# Patient Record
Sex: Female | Born: 1970 | Race: White | Hispanic: No | Marital: Married | Smoking: Never smoker
Health system: Southern US, Community
[De-identification: ages and names within clinical notes are randomized; demographics above are authoritative.]

## PROBLEM LIST (undated history)

## (undated) DIAGNOSIS — R112 Nausea with vomiting, unspecified: Secondary | ICD-10-CM

## (undated) DIAGNOSIS — T8859XA Other complications of anesthesia, initial encounter: Secondary | ICD-10-CM

## (undated) DIAGNOSIS — F329 Major depressive disorder, single episode, unspecified: Secondary | ICD-10-CM

## (undated) DIAGNOSIS — Z9889 Other specified postprocedural states: Secondary | ICD-10-CM

## (undated) DIAGNOSIS — F419 Anxiety disorder, unspecified: Secondary | ICD-10-CM

## (undated) DIAGNOSIS — F32A Depression, unspecified: Secondary | ICD-10-CM

## (undated) HISTORY — PX: WISDOM TOOTH EXTRACTION: SHX21

## (undated) HISTORY — DX: Depression, unspecified: F32.A

## (undated) HISTORY — DX: Major depressive disorder, single episode, unspecified: F32.9

## (undated) HISTORY — DX: Anxiety disorder, unspecified: F41.9

## (undated) HISTORY — PX: DILATION AND CURETTAGE OF UTERUS: SHX78

## (undated) HISTORY — PX: TUBAL LIGATION: SHX77

---

## 2010-02-08 ENCOUNTER — Emergency Department: Payer: Self-pay | Admitting: Emergency Medicine

## 2010-08-03 ENCOUNTER — Ambulatory Visit: Payer: Self-pay

## 2011-09-21 ENCOUNTER — Ambulatory Visit: Payer: Self-pay

## 2012-11-19 ENCOUNTER — Ambulatory Visit: Payer: Self-pay

## 2014-01-30 ENCOUNTER — Ambulatory Visit: Payer: Self-pay

## 2014-12-02 ENCOUNTER — Telehealth: Payer: Self-pay | Admitting: Family Medicine

## 2014-12-02 NOTE — Telephone Encounter (Signed)
Pt called stated she needs a refill on Omeprazole. Pharm is Therapist, [facility] in [place].

## 2014-12-03 MED ORDER — OMEPRAZOLE 20 MG PO CPDR: 20.0000 mg | DELAYED_RELEASE_CAPSULE | Freq: Every day | ORAL | Status: DC

## 2014-12-03 NOTE — Telephone Encounter (Signed)
Please let OSWIN ELLICK know that I'd like to see patient for an appointment here in the office for:  Acid reflux, stomach issues She was seen in March and was asked to f/u about 3 weeks later Please schedule a visit with me  in the next: few weeks Fasting?  Not necessary Thank you, Dr. Sherie Don

## 2014-12-03 NOTE — Telephone Encounter (Signed)
Left vm pt # to call us back and schedule F/U for acid reflux and stomach issues.

## 2014-12-20 ENCOUNTER — Other Ambulatory Visit: Payer: Self-pay | Admitting: Unknown Physician Specialty

## 2014-12-22 NOTE — Telephone Encounter (Signed)
Please contact patient to schedule follow up on her anxiety

## 2014-12-23 NOTE — Telephone Encounter (Signed)
Called and scheduled patient an appointment.

## 2015-01-01 ENCOUNTER — Ambulatory Visit: Payer: Self-pay | Admitting: Family Medicine

## 2015-01-07 DIAGNOSIS — F32A Depression, unspecified: Secondary | ICD-10-CM | POA: Insufficient documentation

## 2015-01-07 DIAGNOSIS — F419 Anxiety disorder, unspecified: Secondary | ICD-10-CM | POA: Insufficient documentation

## 2015-01-07 DIAGNOSIS — F329 Major depressive disorder, single episode, unspecified: Secondary | ICD-10-CM | POA: Insufficient documentation

## 2015-01-08 ENCOUNTER — Encounter: Payer: Self-pay | Admitting: Family Medicine

## 2015-01-08 ENCOUNTER — Ambulatory Visit (INDEPENDENT_AMBULATORY_CARE_PROVIDER_SITE_OTHER): Payer: BC Managed Care – PPO | Admitting: Family Medicine

## 2015-01-08 VITALS — BP 103/71 | HR 80 | Temp 98.2°F | Ht 66.0 in | Wt 157.0 lb

## 2015-01-08 DIAGNOSIS — F329 Major depressive disorder, single episode, unspecified: Secondary | ICD-10-CM

## 2015-01-08 DIAGNOSIS — F419 Anxiety disorder, unspecified: Secondary | ICD-10-CM | POA: Diagnosis not present

## 2015-01-08 DIAGNOSIS — F32A Depression, unspecified: Secondary | ICD-10-CM

## 2015-01-08 DIAGNOSIS — K219 Gastro-esophageal reflux disease without esophagitis: Secondary | ICD-10-CM | POA: Diagnosis not present

## 2015-01-08 MED ORDER — VENLAFAXINE HCL 37.5 MG PO TABS: 37.5000 mg | ORAL_TABLET | Freq: Two times a day (BID) | ORAL | Status: DC

## 2015-01-08 MED ORDER — PROMETHAZINE HCL 25 MG PO TABS: 25.0000 mg | ORAL_TABLET | Freq: Four times a day (QID) | ORAL | Status: DC | PRN

## 2015-01-08 MED ORDER — OMEPRAZOLE 20 MG PO CPDR: 20.0000 mg | DELAYED_RELEASE_CAPSULE | Freq: Every day | ORAL | Status: DC | PRN

## 2015-01-08 NOTE — Assessment & Plan Note (Signed)
Well-controlled on current meds 

## 2015-01-08 NOTE — Patient Instructions (Addendum)
Try to wean down caffeine and carbonated beverages Try elevating the head of the bed You have my blessing to skip days of the omeprazole Do get 3 servings of calcium a day Prescriptions should be ready at [facility] avoidance is key when traveling! Return in 12+ months, sooner if needed  Gastroesophageal Reflux Disease, Adult Gastroesophageal reflux disease (GERD) happens when acid from your stomach flows up into the esophagus. When acid comes in contact with the esophagus, the acid causes soreness (inflammation) in the esophagus. Over time, GERD may create small holes (ulcers) in the lining of the esophagus. CAUSES   Increased body weight. This puts pressure on the stomach, making acid rise from the stomach into the esophagus.  Smoking. This increases acid production in the stomach.  Drinking alcohol. This causes decreased pressure in the lower esophageal sphincter (valve or ring of muscle between the esophagus and stomach), allowing acid from the stomach into the esophagus.  Late evening meals and a full stomach. This increases pressure and acid production in the stomach.  A malformed lower esophageal sphincter. Sometimes, no cause is found. SYMPTOMS   Burning pain in the lower part of the mid-chest behind the breastbone and in the mid-stomach area. This may occur twice a week or more often.  Trouble swallowing.  Sore throat.  Dry cough.  Asthma-like symptoms including chest tightness, shortness of breath, or wheezing. DIAGNOSIS  Your caregiver may be able to diagnose GERD based on your symptoms. In some cases, X-rays and other tests may be done to check for complications or to check the condition of your stomach and esophagus. TREATMENT  Your caregiver may recommend over-the-counter or prescription medicines to help decrease acid production. Ask your caregiver before starting or adding any new medicines.  HOME CARE INSTRUCTIONS   Change the factors that you can  control. Ask your caregiver for guidance concerning weight loss, quitting smoking, and alcohol consumption.  Avoid foods and drinks that make your symptoms worse, such as:  Caffeine or alcoholic drinks.  Chocolate.  Peppermint or mint flavorings.  Garlic and onions.  Spicy foods.  Citrus fruits, such as oranges, lemons, or limes.  Tomato-based foods such as sauce, chili, salsa, and pizza.  Fried and fatty foods.  Avoid lying down for the 3 hours prior to your bedtime or prior to taking a nap.  Eat small, frequent meals instead of large meals.  Wear loose-fitting clothing. Do not wear anything tight around your waist that causes pressure on your stomach.  Raise the head of your bed 6 to 8 inches with wood blocks to help you sleep. Extra pillows will not help.  Only take over-the-counter or prescription medicines for pain, discomfort, or fever as directed by your caregiver.  Do not take aspirin, ibuprofen, or other nonsteroidal anti-inflammatory drugs (NSAIDs). SEEK IMMEDIATE MEDICAL CARE IF:   You have pain in your arms, neck, jaw, teeth, or back.  Your pain increases or changes in intensity or duration.  You develop nausea, vomiting, or sweating (diaphoresis).  You develop shortness of breath, or you faint.  Your vomit is green, yellow, black, or looks like coffee grounds or blood.  Your stool is red, bloody, or black. These symptoms could be signs of other problems, such as heart disease, gastric bleeding, or esophageal bleeding. MAKE SURE YOU:   Understand these instructions.  Will watch your condition.  Will get help right away if you are not doing well or get worse. Document Released: 03/15/2005 Document Revised: 08/28/2011 Document  Reviewed: 12/23/2010 ExitCare Patient Information 2015 [place], [place]. This information is not intended to replace advice given to you by your health care provider. Make sure you discuss any questions you have with your health  care provider.

## 2015-01-08 NOTE — Progress Notes (Signed)
BP 103/71 mmHg  Pulse 80  Temp(Src) 98.2 F (36.8 C)  Ht  (1.676 m)  Wt 157 lb (71.215 kg)  BMI 25.35 kg/m2  SpO2 100%  LMP 12/28/2014 (Exact Date)   Subjective:    Patient ID: Ruth Hammond, female    DOB: [birth year], 44 y.o.   MRN: [id number]  HPI: Ruth Hammond is a 44 y.o. female  Chief Complaint  Patient presents with  . Depression    med refill  . travel medications    just in case meds   She called about two weeks ago and is here for follow-up  She is taking omeprazole; has tried going off of it, and then 1-2 days later, she is still having issues with it She is elevating the head of the bed; she has some herniated discs, C6 and C5 she says; she has not had an epidural or anything She avoids juices, strong and avoids them; she doesn't eat many tomatoes, just once in a while; tomato sauce occasionally; tries to drink more water; does drink coffee, strong No cramps in feet or calves or hands; no skipped heartbeats  Taking venlafaxine; that is working well; no problems with anxiety; no issues with depression; has taken for a long time; she does not think she'll stop this, tried coming off before and did not do well  She is going to [place] and [place] She has been to [place] and got very sick, nasty bladder infection; has medicine for that she keeps on hand from her OB-GYN; she doesn't need that one; her stomach got very upset, nausea, nasty stomach; phenergan anti-nausea just in case; no need for malaria prophylaxis she says  Relevant past medical, surgical, family and social history reviewed and updated as indicated. Interim medical history since our last visit reviewed. Allergies and medications reviewed and updated.  Review of Systems Per HPI unless specifically indicated above     Objective:    BP 103/71 mmHg  Pulse 80  Temp(Src) 98.2 F (36.8 C)  Ht  (1.676 m)  Wt 157 lb (71.215 kg)  BMI 25.35 kg/m2  SpO2 100%  LMP 12/28/2014  (Exact Date)  Wt Readings from Last 3 Encounters:  01/08/15 157 lb (71.215 kg)  09/17/14 156 lb (70.761 kg)    Physical Exam  Constitutional: She appears well-developed and well-nourished. No distress.  Eyes: EOM are normal. No scleral icterus.  Cardiovascular: Normal rate and regular rhythm.   No extrasystoles are present.  Pulmonary/Chest: Effort normal and breath sounds normal.  Abdominal: She exhibits no distension.  Neurological: She displays no tremor.  Reflex Scores:      Patellar reflexes are 2+ on the right side and 2+ on the left side. Skin: No bruising and no rash noted.  Psychiatric: She has a normal mood and affect. Her speech is normal and behavior is normal. Judgment and thought content normal. Cognition and memory are normal.  Vitals reviewed.   No results found for this or any previous visit.    Assessment & Plan:   Problem List Items Addressed This Visit      Digestive   GERD (gastroesophageal reflux disease) - Primary    Avoid triggers; try half decaf coffee, elevated HOB; risks of prolonged PPI discussed      Relevant Medications   omeprazole (PRILOSEC) 20 MG capsule     Other   Anxiety    Well-controlled on current meds      Relevant  Medications   venlafaxine (EFFEXOR) 37.5 MG tablet   Depression    Well-controlled, continue medicine, one year Rx given      Relevant Medications   venlafaxine (EFFEXOR) 37.5 MG tablet      Follow up plan: Return in about 1 year (around 01/08/2016) for mood, reflus.

## 2015-01-08 NOTE — Assessment & Plan Note (Signed)
Well-controlled, continue medicine, one year Rx given

## 2015-01-08 NOTE — Assessment & Plan Note (Addendum)
Avoid triggers; try half decaf coffee, elevated HOB; risks of prolonged PPI discussed

## 2015-01-19 ENCOUNTER — Other Ambulatory Visit: Payer: Self-pay | Admitting: Obstetrics and Gynecology

## 2015-01-19 DIAGNOSIS — Z1231 Encounter for screening mammogram for malignant neoplasm of breast: Secondary | ICD-10-CM

## 2015-02-01 ENCOUNTER — Ambulatory Visit
Admission: RE | Admit: 2015-02-01 | Discharge: 2015-02-01 | Disposition: A | Discharge status: Home / Self Care | Payer: BC Managed Care – PPO | Source: Ambulatory Visit | Attending: Obstetrics and Gynecology | Admitting: Obstetrics and Gynecology

## 2015-02-01 DIAGNOSIS — Z1231 Encounter for screening mammogram for malignant neoplasm of breast: Secondary | ICD-10-CM | POA: Insufficient documentation

## 2015-02-03 ENCOUNTER — Other Ambulatory Visit: Payer: Self-pay | Admitting: Obstetrics and Gynecology

## 2015-02-03 DIAGNOSIS — N63 Unspecified lump in unspecified breast: Secondary | ICD-10-CM

## 2015-02-03 DIAGNOSIS — R928 Other abnormal and inconclusive findings on diagnostic imaging of breast: Secondary | ICD-10-CM

## 2015-02-04 ENCOUNTER — Ambulatory Visit: Payer: BC Managed Care – PPO

## 2015-02-04 ENCOUNTER — Ambulatory Visit
Admission: RE | Admit: 2015-02-04 | Discharge: 2015-02-04 | Disposition: A | Discharge status: Home / Self Care | Payer: BC Managed Care – PPO | Source: Ambulatory Visit | Attending: Obstetrics and Gynecology | Admitting: Obstetrics and Gynecology

## 2015-02-04 DIAGNOSIS — R928 Other abnormal and inconclusive findings on diagnostic imaging of breast: Secondary | ICD-10-CM | POA: Insufficient documentation

## 2015-02-04 DIAGNOSIS — N63 Unspecified lump in unspecified breast: Secondary | ICD-10-CM

## 2015-05-27 ENCOUNTER — Ambulatory Visit: Payer: BC Managed Care – PPO | Admitting: Family Medicine

## 2015-06-07 ENCOUNTER — Encounter: Payer: Self-pay | Admitting: Family Medicine

## 2015-06-07 ENCOUNTER — Ambulatory Visit (INDEPENDENT_AMBULATORY_CARE_PROVIDER_SITE_OTHER): Payer: BC Managed Care – PPO | Admitting: Family Medicine

## 2015-06-07 VITALS — BP 109/77 | HR 78 | Temp 98.1°F | Ht 66.0 in | Wt 157.0 lb

## 2015-06-07 DIAGNOSIS — R51 Headache: Secondary | ICD-10-CM | POA: Diagnosis not present

## 2015-06-07 DIAGNOSIS — M5412 Radiculopathy, cervical region: Secondary | ICD-10-CM | POA: Diagnosis not present

## 2015-06-07 DIAGNOSIS — G8929 Other chronic pain: Secondary | ICD-10-CM

## 2015-06-07 MED ORDER — PROMETHAZINE HCL 25 MG PO TABS: 25.0000 mg | ORAL_TABLET | Freq: Four times a day (QID) | ORAL | Status: DC | PRN

## 2015-06-07 MED ORDER — GABAPENTIN 100 MG PO CAPS: 100.0000 mg | ORAL_CAPSULE | Freq: Every day | ORAL | Status: DC

## 2015-06-07 NOTE — Patient Instructions (Addendum)
I've put in the referral to Dr. Manson PasseyBrown for you If you have not heard anything from my staff in a week about any orders/referrals/studies from today, please contact us here to follow-up [phone] We'll get the head CT just to be thorough We'll start the gabapentin Use the phenergan if needed If you have any problems, please call Do try to get three servings of calcium a day and supplement if needed

## 2015-06-07 NOTE — Progress Notes (Signed)
BP 109/77 mmHg  Pulse 78  Temp(Src) 98.1 F (36.7 C)  Ht 5\' 6"  (1.676 m)  Wt 157 lb (71.215 kg)  BMI 25.35 kg/m2  SpO2 99%  LMP 06/02/2015 (Exact Date)   Subjective:    Patient ID: Ruth Hammond, female    DOB: 03/07/1971, 44 y.o.   MRN: [id number]  HPI: Ruth IbaLaura C Kegel is a 44 y.o. female  Chief Complaint  Patient presents with  . Referral    She'd like to get a referral to a neurologist for 2 herniated disks in her neck, c5-c6   She says about two years ago, maybe two and a half years ago,, she had a very difficult class, high stress, teaching fourth grade; had pain in the left bicep; thought maybe pulled a muscle; went [facility] [place] (ortho); what he tried didn't work, then in May she returned and they did an MRI; she has two herniated discs at "C5 and C6" she thinks, referred to Dr. Trey SailorsMark Roy in [place], neurologist; saw him over the summer; he recommended a pain management doctor for a shot in her neck, so 18 months ago; her mother was sick, she didn't feel comfortable getting a shot in her neck; did not go get it, just too much sickness in her fmaily at that time; did not go back to him; probably should have she says She is having tingling and numbness in her 4th and 5th fingers of the left hand Severe headaches once a month or so, last a whole day; she told her GYN about them; told her to tell the neurologist; thought maybe a hormonal; not always with her period though; the last two months, would start the day after her period started; before then it was random; first day it's really bad, then better after that; starts back of the left side occiput and goes over the ear to the temple; nothing really helps it; she has tried ibuprofen, migraine medicine, nothing touches it; she lays down and tries to sleep it off; causes nausea; uses phenergan and that helps her to get to sleep No confusion, no daily headaches, not waking up with vomiting No aura; no limitations of neck  movement, but does feel like a crick in the neck  Also having some pain on the right now; everything previously was on the left She would like to see Dr. Erroll Lunahristopher Brown Pain of the headaches gets to a 9 out of 10 Pain of the left arm is like a toothache, worst pain is 5 out 10 She is left-handed  Relevant past medical, surgical, family and social history reviewed and updated as indicated. Interim medical history since our last visit reviewed. Allergies and medications reviewed and updated.  Review of Systems Per HPI unless specifically indicated above     Objective:    BP 109/77 mmHg  Pulse 78  Temp(Src) 98.1 F (36.7 C)  Ht 5\' 6"  (1.676 m)  Wt 157 lb (71.215 kg)  BMI 25.35 kg/m2  SpO2 99%  LMP 06/02/2015 (Exact Date)  Wt Readings from Last 3 Encounters:  06/07/15 157 lb (71.215 kg)  01/08/15 157 lb (71.215 kg)  09/17/14 156 lb (70.761 kg)    Physical Exam  Constitutional: She appears well-developed and well-nourished. No distress.  HENT:  Head: Normocephalic and atraumatic.  Right Ear: External ear normal.  Left Ear: External ear normal.  Nose: Nose normal.  Mouth/Throat: Oropharynx is clear and moist. No oropharyngeal exudate.  Eyes: EOM are normal.  Pupils are equal, round, and reactive to light. Right eye exhibits no discharge. Left eye exhibits no discharge. No scleral icterus.  Neck: Neck supple. No JVD present. No thyromegaly present.  Cardiovascular: Normal rate and regular rhythm.   Pulmonary/Chest: Effort normal and breath sounds normal.  Musculoskeletal: She exhibits no edema.  Lymphadenopathy:    She has no cervical adenopathy.  Neurological: She is alert. She displays no atrophy and normal reflexes. No cranial nerve deficit. She exhibits normal muscle tone. Coordination normal.  Skin: She is not diaphoretic. No erythema. No pallor.  Psychiatric: She has a normal mood and affect. Her behavior is normal. Judgment and thought content normal.       Assessment & Plan:   Problem List Items Addressed This Visit      Nervous and Auditory   Left cervical radiculopathy - Primary    Will be glad to refer her to the spine surgeon for evaluation of this per her request; referral entered; offered gabapentin, start Rx, we may titrate up if needed (patient may call)      Relevant Medications   gabapentin (NEURONTIN) 100 MG capsule   Other Relevant Orders   Ambulatory referral to Orthopedic Surgery    Other Visit Diagnoses    Chronic left-sided headaches        may be C3 radiculopathy, but unilaterality is concerning; will get CT scan    Relevant Orders    CT Head W Contrast       Follow up plan: Return if symptoms worsen or fail to improve.  An after-visit summary was printed and given to the patient at check-out.  Please see the patient instructions which may contain other information and recommendations beyond what is mentioned above in the assessment and plan. Orders Placed This Encounter  Procedures  . CT Head W Contrast  . Ambulatory referral to Orthopedic Surgery   Meds ordered this encounter  Medications  . gabapentin (NEURONTIN) 100 MG capsule    Sig: Take 1 capsule (100 mg total) by mouth at bedtime.    Dispense:  30 capsule    Refill:  0  . promethazine (PHENERGAN) 25 MG tablet    Sig: Take 1 tablet (25 mg total) by mouth every 6 (six) hours as needed for nausea or vomiting.    Dispense:  30 tablet    Refill:  0   Face-to-face time with patient was more than 25 minutes, >50% time spent counseling and coordination of care

## 2015-06-12 NOTE — Assessment & Plan Note (Signed)
Will be glad to refer her to the spine surgeon for evaluation of this per her request; referral entered; offered gabapentin, start Rx, we may titrate up if needed (patient may call)

## 2015-06-15 ENCOUNTER — Telehealth: Payer: Self-pay | Admitting: Family Medicine

## 2015-06-15 DIAGNOSIS — G8929 Other chronic pain: Secondary | ICD-10-CM

## 2015-06-15 DIAGNOSIS — R51 Headache: Principal | ICD-10-CM

## 2015-06-15 DIAGNOSIS — R519 Headache, unspecified: Secondary | ICD-10-CM | POA: Insufficient documentation

## 2015-06-15 NOTE — Telephone Encounter (Signed)
Cannot do CT of the head just with contrast, must be without or with and without.  Please call to clarify.

## 2015-06-15 NOTE — Telephone Encounter (Signed)
New order entered for with and without contrast Please CANCEL the head CT that just with contrast; we'll get with and without Thank you

## 2015-06-16 ENCOUNTER — Ambulatory Visit
Admission: RE | Admit: 2015-06-16 | Discharge: 2015-06-16 | Disposition: A | Discharge status: Home / Self Care | Payer: BC Managed Care – PPO | Source: Ambulatory Visit | Attending: Family Medicine | Admitting: Family Medicine

## 2015-06-16 DIAGNOSIS — R51 Headache: Secondary | ICD-10-CM | POA: Diagnosis not present

## 2015-06-16 DIAGNOSIS — G8929 Other chronic pain: Secondary | ICD-10-CM | POA: Insufficient documentation

## 2015-06-16 MED ORDER — IOHEXOL 300 MG/ML  SOLN
75.0000 mL | Freq: Once | INTRAMUSCULAR | Status: AC | PRN
Start: 2015-06-16 — End: 2015-06-16
  Administered 2015-06-16: 75 mL via INTRAVENOUS

## 2015-06-16 NOTE — Telephone Encounter (Signed)
Insurance has already approved her previous study the CT head with contrast only, even without having a previous study of CT head without contrast.

## 2015-10-18 HISTORY — PX: OTHER SURGICAL HISTORY: SHX169

## 2015-12-23 ENCOUNTER — Other Ambulatory Visit: Payer: Self-pay | Admitting: Family Medicine

## 2015-12-23 DIAGNOSIS — Z1231 Encounter for screening mammogram for malignant neoplasm of breast: Secondary | ICD-10-CM

## 2016-01-02 ENCOUNTER — Encounter: Payer: Self-pay | Admitting: Family Medicine

## 2016-01-02 DIAGNOSIS — Z01419 Encounter for gynecological examination (general) (routine) without abnormal findings: Secondary | ICD-10-CM | POA: Insufficient documentation

## 2016-02-02 ENCOUNTER — Other Ambulatory Visit: Payer: Self-pay | Admitting: Family Medicine

## 2016-02-02 ENCOUNTER — Ambulatory Visit
Admission: RE | Admit: 2016-02-02 | Discharge: 2016-02-02 | Disposition: A | Discharge status: Home / Self Care | Payer: BC Managed Care – PPO | Source: Ambulatory Visit | Attending: Family Medicine | Admitting: Family Medicine

## 2016-02-02 DIAGNOSIS — Z1231 Encounter for screening mammogram for malignant neoplasm of breast: Secondary | ICD-10-CM

## 2016-02-24 ENCOUNTER — Other Ambulatory Visit: Payer: Self-pay | Admitting: Family Medicine

## 2016-02-24 NOTE — Telephone Encounter (Signed)
It looks like Dr. Dossie Arbourrissman has ordered her mammogram; please find out [facility] or [facility]; Rx request should go to provider she will be seeing please

## 2016-02-24 NOTE — Telephone Encounter (Signed)
Left voice mail

## 2016-04-03 ENCOUNTER — Other Ambulatory Visit: Payer: Self-pay | Admitting: Family Medicine

## 2016-04-28 ENCOUNTER — Other Ambulatory Visit: Payer: Self-pay | Admitting: Family Medicine

## 2016-04-28 NOTE — Telephone Encounter (Signed)
Routing to provider. No follow up on file. 

## 2016-05-04 ENCOUNTER — Telehealth: Payer: Self-pay | Admitting: Family Medicine

## 2016-05-04 MED ORDER — VENLAFAXINE HCL 37.5 MG PO TABS: 37.5000 mg | ORAL_TABLET | Freq: Two times a day (BID) | ORAL | 1 refills | Status: DC

## 2016-05-04 NOTE — Telephone Encounter (Signed)
Routing to provider  

## 2016-05-15 ENCOUNTER — Ambulatory Visit: Payer: BC Managed Care – PPO | Admitting: Family Medicine

## 2016-05-18 ENCOUNTER — Ambulatory Visit (INDEPENDENT_AMBULATORY_CARE_PROVIDER_SITE_OTHER): Payer: BC Managed Care – PPO | Admitting: Family Medicine

## 2016-05-18 ENCOUNTER — Encounter: Payer: Self-pay | Admitting: Family Medicine

## 2016-05-18 VITALS — BP 115/73 | HR 88 | Temp 98.6°F | Wt 143.0 lb

## 2016-05-18 DIAGNOSIS — F330 Major depressive disorder, recurrent, mild: Secondary | ICD-10-CM

## 2016-05-18 DIAGNOSIS — Z23 Encounter for immunization: Secondary | ICD-10-CM

## 2016-05-18 MED ORDER — VENLAFAXINE HCL 37.5 MG PO TABS: 37.5000 mg | ORAL_TABLET | Freq: Two times a day (BID) | ORAL | 4 refills | Status: DC

## 2016-05-18 NOTE — Assessment & Plan Note (Signed)
Stable on effexor, continue current regimen 

## 2016-05-18 NOTE — Patient Instructions (Signed)
Follow up as needed

## 2016-05-18 NOTE — Progress Notes (Signed)
   BP 115/73   Pulse 88   Temp 98.6 F (37 C)   Wt 143 lb (64.9 kg)   LMP 05/14/2016 (Exact Date)   SpO2 100%   BMI 23.08 kg/m    Subjective:    Patient ID: Ruth Hammond, female    DOB: [birth year], 45 y.o.   MRN: [id number]  HPI: Ruth IbaLaura C Granieri is a 45 y.o. female  Chief Complaint  Patient presents with  . Depression    she needs a refill on her Effexor, she is doing well on it.   Patient presents for depression follow up. Doing very well on effexor, has been on the medication for years. No mood concerns, SI/HI, or other issues noted. Taking the medication faithfully without side effects.   Relevant past medical, surgical, family and social history reviewed and updated as indicated. Interim medical history since our last visit reviewed. Allergies and medications reviewed and updated.  Depression screen PHQ 2/9 05/18/2016  Decreased Interest 0  Down, Depressed, Hopeless 0  PHQ - 2 Score 0  Altered sleeping 0  Tired, decreased energy 0  Change in appetite 0  Feeling bad or failure about yourself  0  Trouble concentrating 0  Moving slowly or fidgety/restless 0  Suicidal thoughts 0  PHQ-9 Score 0   Review of Systems  Constitutional: Negative.   HENT: Negative.   Eyes: Negative.   Respiratory: Negative.   Cardiovascular: Negative.   Gastrointestinal: Negative.   Genitourinary: Negative.   Musculoskeletal: Negative.   Skin: Negative.   Neurological: Negative.   Psychiatric/Behavioral: Negative.     Per HPI unless specifically indicated above     Objective:    BP 115/73   Pulse 88   Temp 98.6 F (37 C)   Wt 143 lb (64.9 kg)   LMP 05/14/2016 (Exact Date)   SpO2 100%   BMI 23.08 kg/m   Wt Readings from Last 3 Encounters:  05/18/16 143 lb (64.9 kg)  06/07/15 157 lb (71.2 kg)  01/08/15 157 lb (71.2 kg)    Physical Exam  Constitutional: She is oriented to person, place, and time. She appears well-developed and well-nourished. No distress.  HENT:    Head: Atraumatic.  Eyes: Conjunctivae are normal. No scleral icterus.  Neck: Normal range of motion. Neck supple.  Cardiovascular: Normal rate and normal heart sounds.   Pulmonary/Chest: Effort normal. No respiratory distress.  Musculoskeletal: Normal range of motion.  Neurological: She is alert and oriented to person, place, and time.  Skin: Skin is warm and dry.  Psychiatric: She has a normal mood and affect. Her behavior is normal.  Nursing note and vitals reviewed.   No results found for this or any previous visit.    Assessment & Plan:   Problem List Items Addressed This Visit      Other   Depression    Stable on effexor, continue current regimen.       Relevant Medications   venlafaxine (EFFEXOR) 37.5 MG tablet    Other Visit Diagnoses    Needs flu shot    -  Primary   Encounter for immunization       Relevant Orders   Flu Vaccine QUAD 36+ mos IM (Completed)       Follow up plan: Return in about 1 year (around 05/18/2017) for Depression follow up. Gets physicals done with GYN. UTD on all health maintenance.

## 2016-07-21 IMAGING — MG MAM DGTL DIAGNOSTIC MAMMO W/CAD
4 series · 4 of 4 positions shown · non-contrast
Comparison: 11/19/2012 and prior mammograms dating back to
08/03/2010

CLINICAL DATA: 42-year-old female for annual bilateral mammograms
and retroareolar left breast and left nipple pain.

EXAM:
DIGITAL DIAGNOSTIC  BILATERAL MAMMOGRAM WITH CAD
ULTRASOUND LEFT BREAST

[R CC]
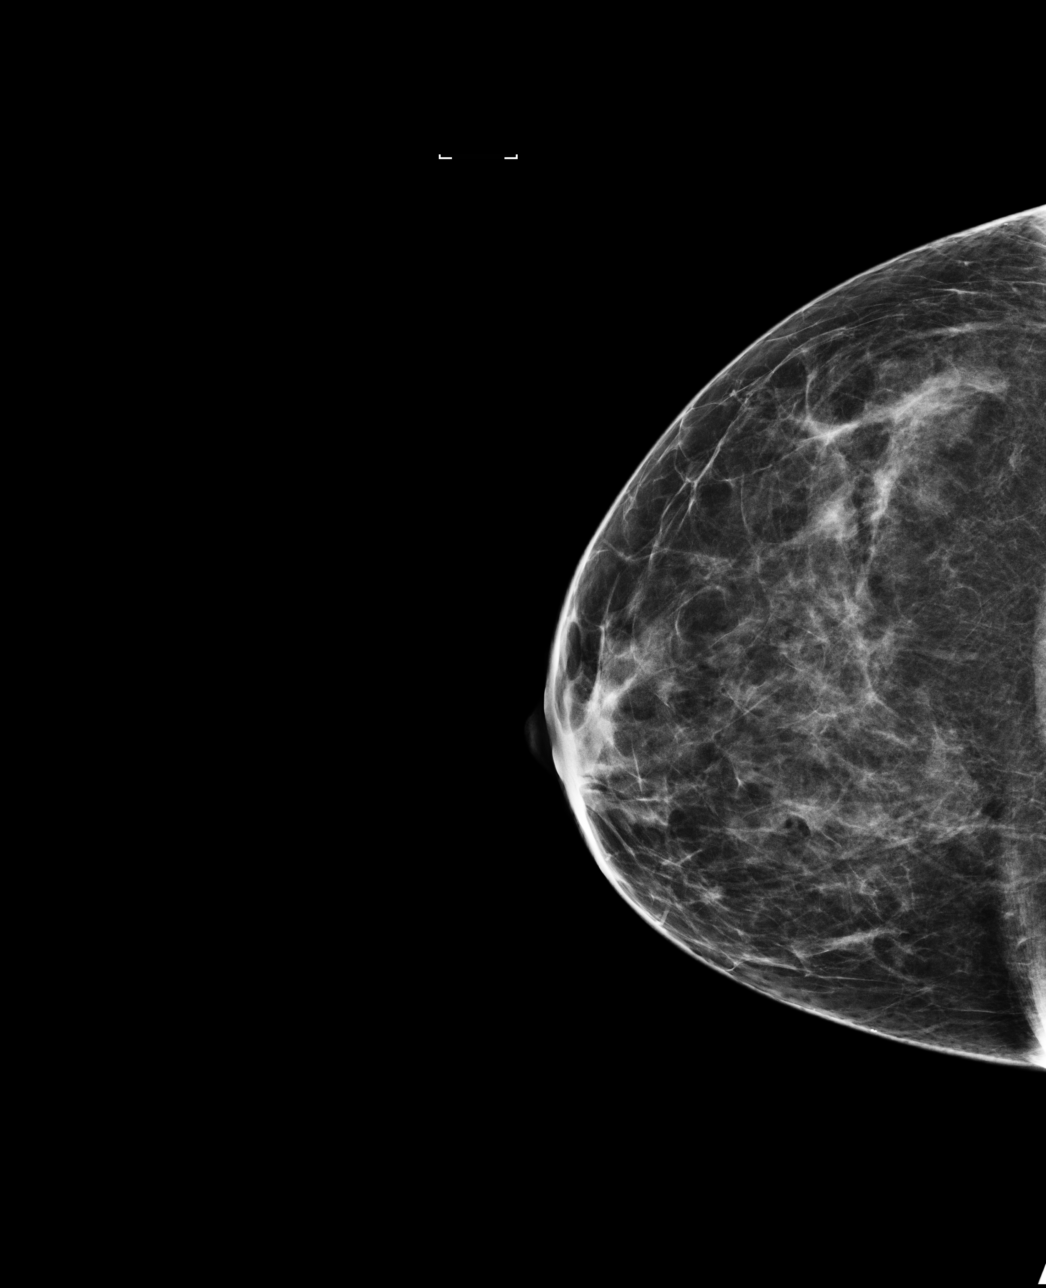

[L MLO]
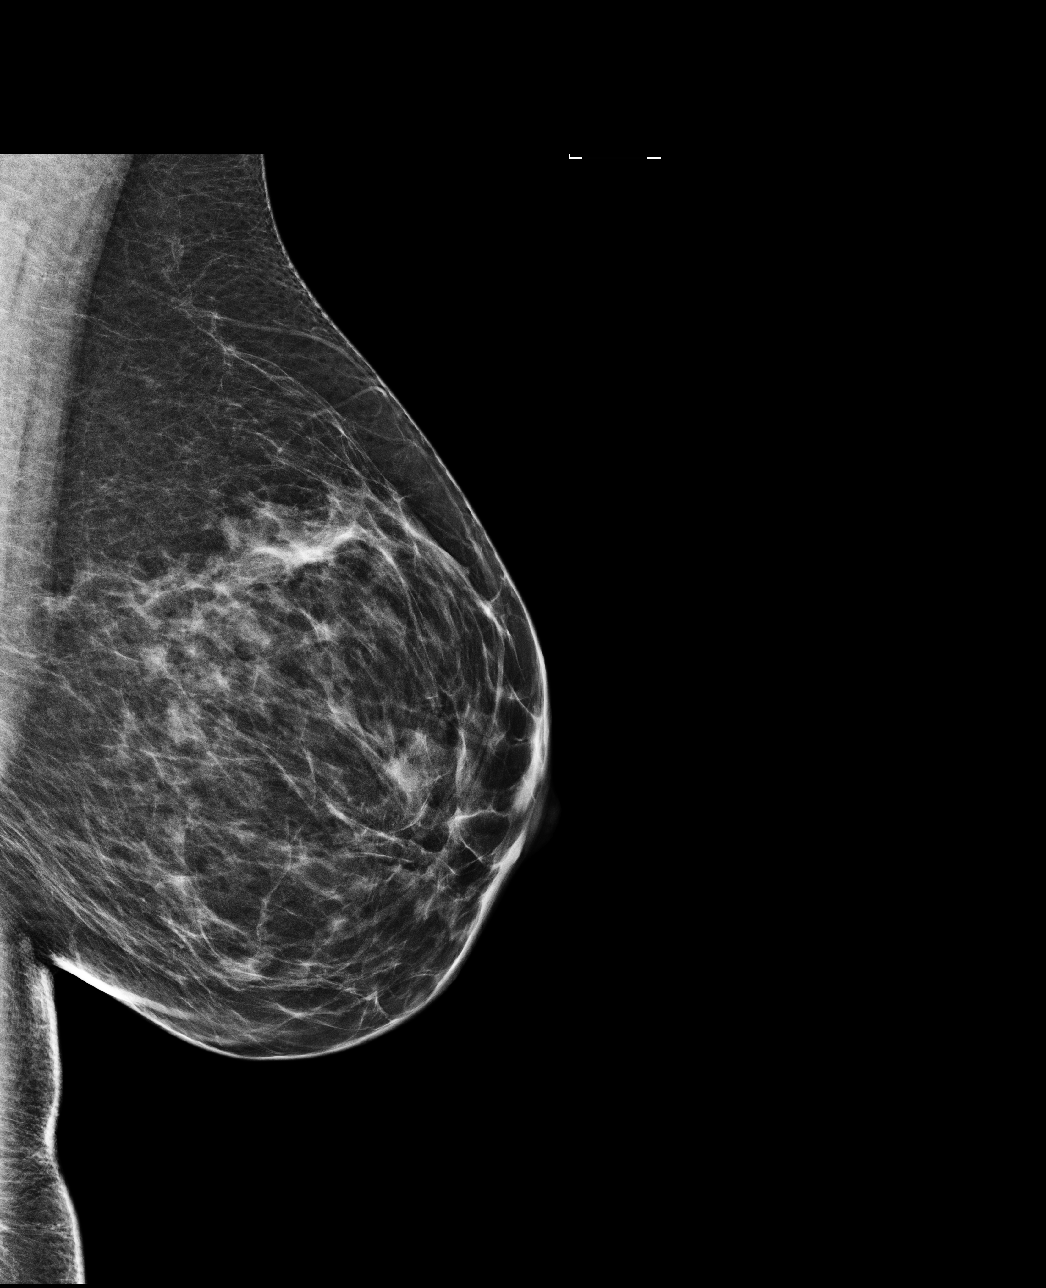

[L CC]
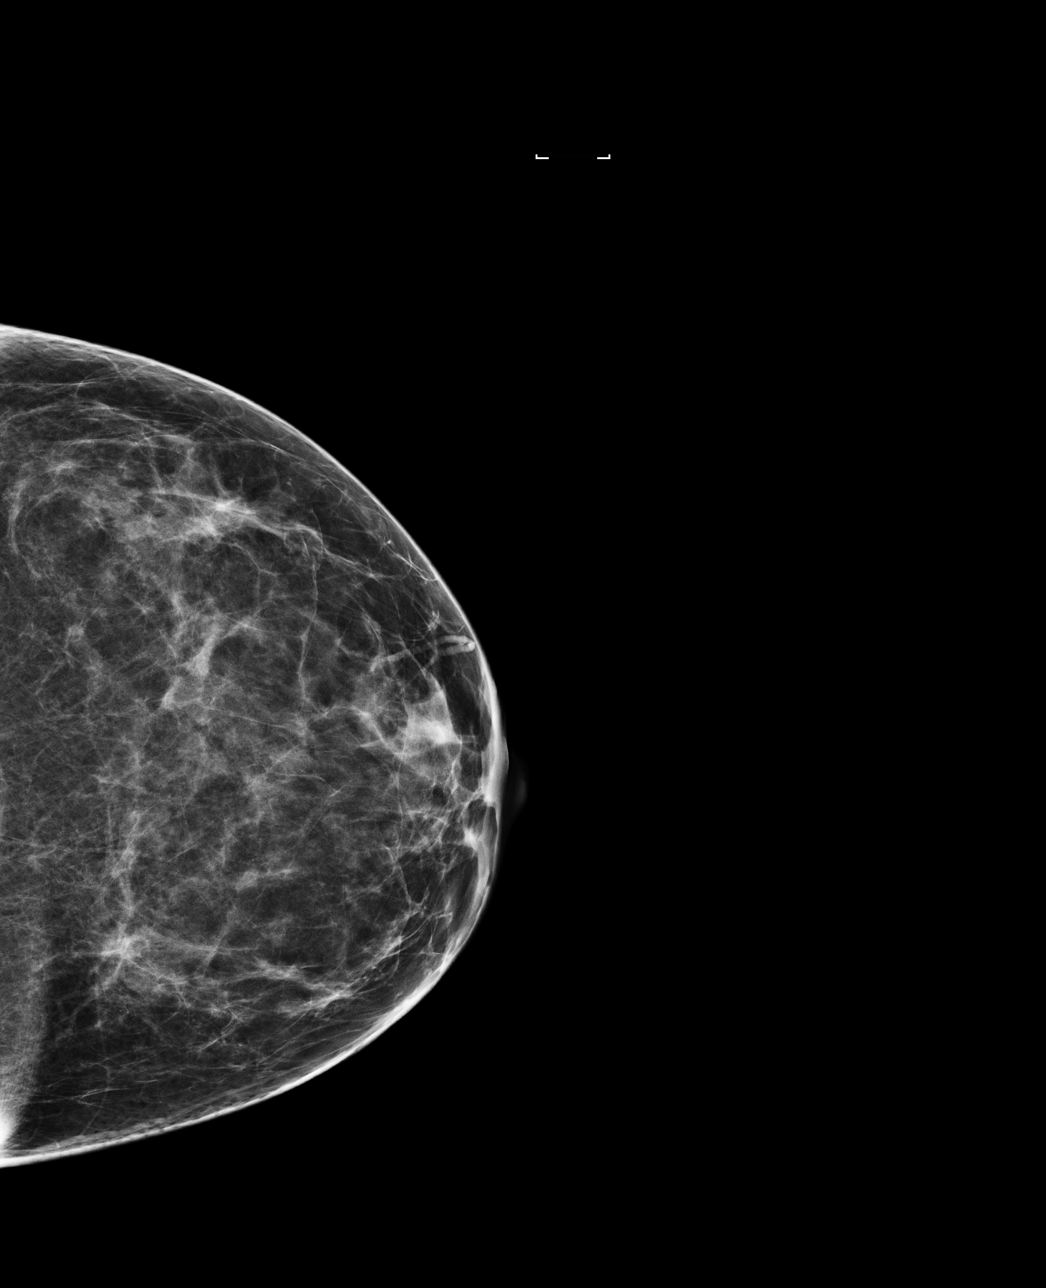

[R MLO]
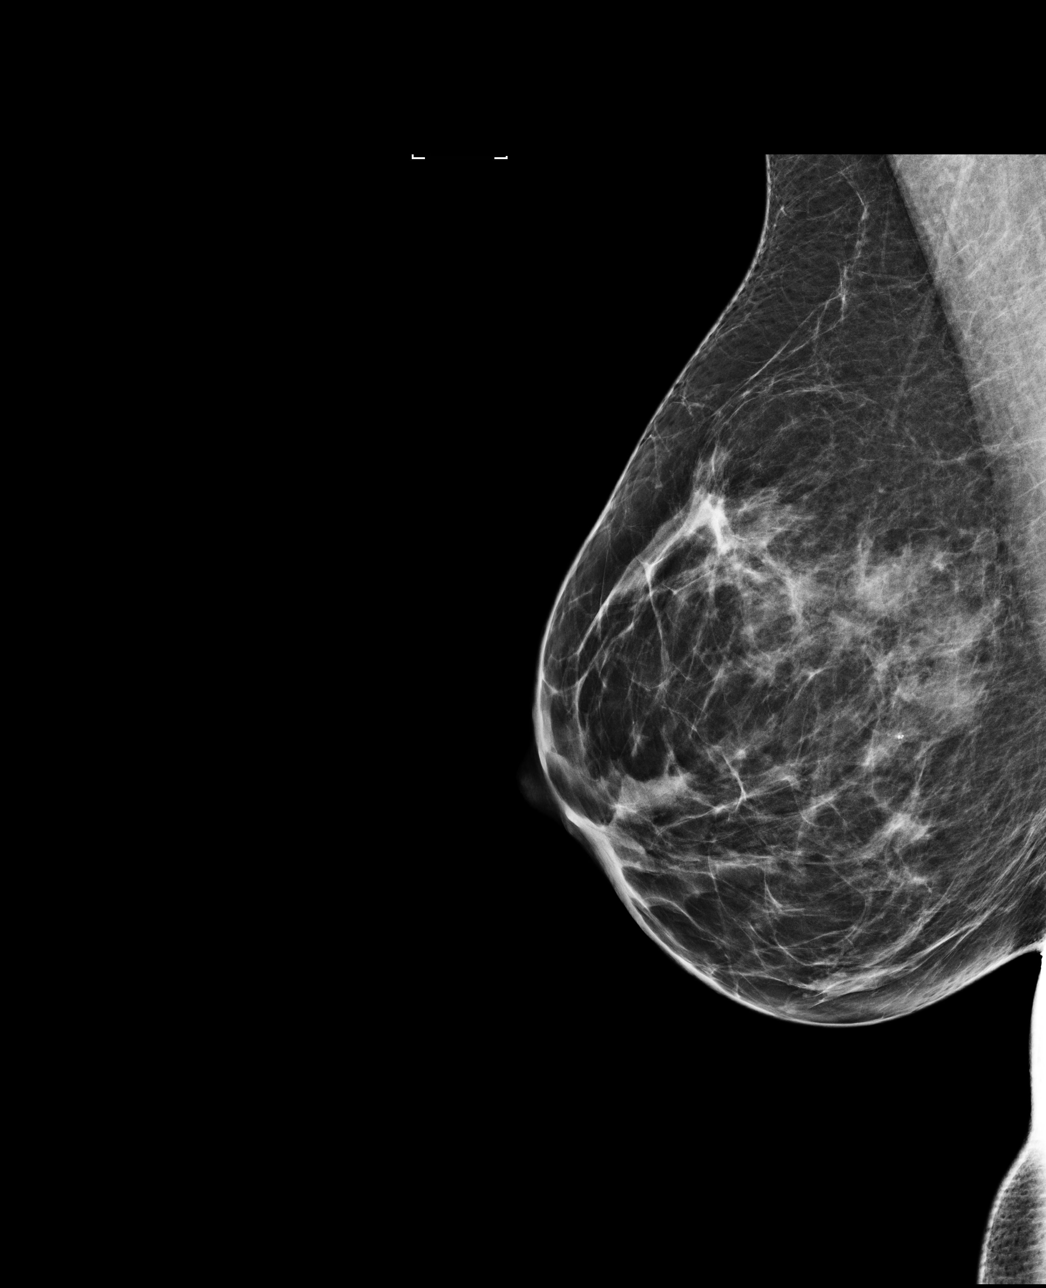

[4 of 4 positions shown; findings below may reference images not displayed]

ACR Breast Density Category b: There are scattered areas of
fibroglandular density.
FINDINGS: Routine views of both breasts demonstrate no suspicious mass,
distortion or worrisome calcifications.

Mammographic images were processed with CAD.

On physical exam, no left nipple abnormalities are identified. No
palpable abnormalities are identified in the retroareolar left
breast.

Ultrasound is performed, showing no evidence of solid or cystic
mass, distortion or abnormal areas of shadowing within the
retroareolar left breast.
IMPRESSION: No mammographic, palpable or sonographic abnormalities within the
retroareolar left breast. The left nipple appears unremarkable.

No mammographic evidence of breast malignancy bilaterally.

RECOMMENDATION:
Bilateral screening mammograms in 1 year.

I have discussed the findings, causes of breast pain and
recommendations with the patient. Results were also provided in
writing at the conclusion of the visit. If applicable, a reminder
letter will be sent to the patient regarding the next appointment.

BI-RADS CATEGORY  1: Negative.

## 2017-06-17 ENCOUNTER — Other Ambulatory Visit: Payer: Self-pay | Admitting: Family Medicine

## 2017-06-18 ENCOUNTER — Telehealth: Payer: Self-pay | Admitting: Family Medicine

## 2017-06-18 NOTE — Telephone Encounter (Signed)
Copied from CRM [phone]. Topic: Quick Communication - Rx Refill/Question >> Jun 18, 2017  4:29 PM Stephannie LiSimmons, Loukisha Gunnerson L, NT wrote: Has the patient contacted their pharmacy? {yes  (Agent: If no, request that the patient contact the pharmacy for the refill. Preferred Pharmacy (with phone number or street name): [facility] in [place] Agent: Please be advised that RX refills may take up to 3 business days. We ask that you follow-up with your pharmacy. Patient needs refill for venlafaxine 37.5 pharmacy said they have requested this refill/ please advise [phone]

## 2017-06-20 ENCOUNTER — Other Ambulatory Visit: Payer: Self-pay | Admitting: *Deleted

## 2017-06-20 MED ORDER — VENLAFAXINE HCL 37.5 MG PO TABS: 37.5000 mg | ORAL_TABLET | Freq: Two times a day (BID) | ORAL | 0 refills | Status: DC

## 2017-06-20 NOTE — Telephone Encounter (Signed)
Call to patient- left message to call to schedule annual exam so we can refill requested medication.

## 2017-06-20 NOTE — Telephone Encounter (Signed)
Will renew script at her appt

## 2017-06-20 NOTE — Telephone Encounter (Signed)
Patient scheduled appointment- Rx for 1 month sent in. Will need RF for the year at visit.

## 2017-06-21 ENCOUNTER — Encounter: Payer: BC Managed Care – PPO | Admitting: Family Medicine

## 2017-06-27 ENCOUNTER — Ambulatory Visit: Payer: BC Managed Care – PPO | Admitting: Family Medicine

## 2017-06-27 VITALS — BP 120/79 | HR 79 | Temp 97.8°F | Wt 146.7 lb

## 2017-06-27 DIAGNOSIS — L309 Dermatitis, unspecified: Secondary | ICD-10-CM

## 2017-06-27 DIAGNOSIS — F330 Major depressive disorder, recurrent, mild: Secondary | ICD-10-CM | POA: Diagnosis not present

## 2017-06-27 MED ORDER — TRIAMCINOLONE ACETONIDE 0.5 % EX OINT: 1.0000 "application " | TOPICAL_OINTMENT | Freq: Two times a day (BID) | CUTANEOUS | 6 refills | Status: DC

## 2017-06-27 MED ORDER — VENLAFAXINE HCL 37.5 MG PO TABS: 37.5000 mg | ORAL_TABLET | Freq: Two times a day (BID) | ORAL | 11 refills | Status: DC

## 2017-06-27 NOTE — Progress Notes (Signed)
   BP 120/79 (BP Location: Left Arm, Patient Position: Sitting)   Pulse 79   Temp 97.8 F (36.6 C) (Temporal)   Wt 146 lb 11.2 oz (66.5 kg)   BMI 23.68 kg/m    Subjective:    Patient ID: Ruth Hammond, female    DOB: 05-29-1971, 47 y.o.   MRN: [id number]  HPI: Ruth IbaLaura C Brent is a 47 y.o. female  Chief Complaint  Patient presents with  . Medication Refill   Pt here today for effexor refills. Doing well on the medicine, no concerns. Controlling her moods well.   Also concerned about some dry, flaking patches that appeared around her mouth back in the fall. Seem to come and go, worsening with winter. Has not been trying much other than hydrocortisone OTC. Does have a hx of eczema.   Relevant past medical, surgical, family and social history reviewed and updated as indicated. Interim medical history since our last visit reviewed. Allergies and medications reviewed and updated.  Review of Systems  Constitutional: Negative.   Respiratory: Negative.   Cardiovascular: Negative.   Gastrointestinal: Negative.   Genitourinary: Negative.   Musculoskeletal: Negative.   Skin: Positive for rash.  Neurological: Negative.   Psychiatric/Behavioral: Negative.  Negative for suicidal ideas.   Per HPI unless specifically indicated above     Objective:    BP 120/79 (BP Location: Left Arm, Patient Position: Sitting)   Pulse 79   Temp 97.8 F (36.6 C) (Temporal)   Wt 146 lb 11.2 oz (66.5 kg)   BMI 23.68 kg/m   Wt Readings from Last 3 Encounters:  06/27/17 146 lb 11.2 oz (66.5 kg)  05/18/16 143 lb (64.9 kg)  06/07/15 157 lb (71.2 kg)    Physical Exam  Constitutional: She is oriented to person, place, and time. She appears well-developed and well-nourished. No distress.  HENT:  Head: Atraumatic.  Eyes: Conjunctivae are normal. Pupils are equal, round, and reactive to light.  Neck: Normal range of motion. Neck supple.  Cardiovascular: Normal rate and normal heart sounds.    Pulmonary/Chest: Effort normal and breath sounds normal.  Musculoskeletal: Normal range of motion.  Neurological: She is alert and oriented to person, place, and time.  Skin: Skin is warm and dry. Rash (Dry, flaking patches near mouth) noted.  Psychiatric: She has a normal mood and affect. Her behavior is normal.  Nursing note and vitals reviewed.   No results found for this or any previous visit.    Assessment & Plan:   Problem List Items Addressed This Visit      Other   Depression - Primary    Stable on effexor, continue current regimen      Relevant Medications   venlafaxine (EFFEXOR) 37.5 MG tablet    Other Visit Diagnoses    Eczema, unspecified type       Discussed mixing triamcinolone cream with some moisturizing cream and applying to areas twice daily. For severe flares, can apply directly to areas       Follow up plan: Return in about 1 year (around 06/27/2018) for Depression f/u.

## 2017-06-28 ENCOUNTER — Encounter: Payer: BC Managed Care – PPO | Admitting: Family Medicine

## 2017-06-30 NOTE — Patient Instructions (Signed)
Follow up as needed

## 2017-06-30 NOTE — Assessment & Plan Note (Signed)
Stable on effexor, continue current regimen 

## 2017-07-01 ENCOUNTER — Other Ambulatory Visit: Payer: Self-pay | Admitting: Family Medicine

## 2017-08-06 ENCOUNTER — Ambulatory Visit: Payer: BC Managed Care – PPO | Admitting: Family Medicine

## 2017-08-06 ENCOUNTER — Encounter: Payer: Self-pay | Admitting: Family Medicine

## 2017-08-06 DIAGNOSIS — G43909 Migraine, unspecified, not intractable, without status migrainosus: Secondary | ICD-10-CM

## 2017-08-06 MED ORDER — RIZATRIPTAN BENZOATE 10 MG PO TABS: 10.0000 mg | ORAL_TABLET | ORAL | 1 refills | Status: DC | PRN

## 2017-08-06 NOTE — Patient Instructions (Signed)
Follow up as needed

## 2017-08-06 NOTE — Assessment & Plan Note (Signed)
Long discussion about multiple options, including daily vs abortive tx. Pt opting to try maxalt prn. Will f/u if no relief with that regimen.

## 2017-08-06 NOTE — Progress Notes (Signed)
   BP 102/70 (BP Location: Right Arm, Patient Position: Sitting, Cuff Size: Normal)   Pulse 79   Temp 99.1 F (37.3 C) (Tympanic)   Wt 150 lb 12.8 oz (68.4 kg)   SpO2 96%   BMI 24.34 kg/m    Subjective:    Patient ID: Ruth Hammond, female    DOB: Nov 01, 1970, 47 y.o.   MRN: [id number]  HPI: Ruth Hammond is a 47 y.o. female  Chief Complaint  Patient presents with  . Headache    Ongoing for 4 years. Recently have became worse. Last one was 2 weeks ago w/ severe symptoms such as vomiting. They'll last for 2 days.   4 year hx of headaches, the last 2 years they seem to be consistently falling around her period and last 2-3 days each time. Can come with phonophobia, nausea and vomiting, no visual disturbances noted. Has tried all of the OTC pain and HA relievers with minimal relief. Pain is on left side of face down toward base of skull. Has tried imitrex 20 years ago once or twice and had some dizziness with it. Has never tried anything prescription grade since.   Relevant past medical, surgical, family and social history reviewed and updated as indicated. Interim medical history since our last visit reviewed. Allergies and medications reviewed and updated.  Review of Systems  Per HPI unless specifically indicated above     Objective:    BP 102/70 (BP Location: Right Arm, Patient Position: Sitting, Cuff Size: Normal)   Pulse 79   Temp 99.1 F (37.3 C) (Tympanic)   Wt 150 lb 12.8 oz (68.4 kg)   SpO2 96%   BMI 24.34 kg/m   Wt Readings from Last 3 Encounters:  08/06/17 150 lb 12.8 oz (68.4 kg)  06/27/17 146 lb 11.2 oz (66.5 kg)  05/18/16 143 lb (64.9 kg)    Physical Exam  Constitutional: She is oriented to person, place, and time. She appears well-developed and well-nourished. No distress.  HENT:  Head: Atraumatic.  Eyes: Conjunctivae are normal. Pupils are equal, round, and reactive to light. No scleral icterus.  Neck: Normal range of motion. Neck supple.    Cardiovascular: Normal rate and normal heart sounds.  Pulmonary/Chest: Effort normal and breath sounds normal. No respiratory distress.  Abdominal: Soft. Bowel sounds are normal. She exhibits no distension. There is no tenderness.  Musculoskeletal: Normal range of motion.  Neurological: She is alert and oriented to person, place, and time. No cranial nerve deficit.  Skin: Skin is warm and dry.  Psychiatric: She has a normal mood and affect. Her behavior is normal.  Nursing note and vitals reviewed.  No results found for this or any previous visit.    Assessment & Plan:   Problem List Items Addressed This Visit      Cardiovascular and Mediastinum   Migraine    Long discussion about multiple options, including daily vs abortive tx. Pt opting to try maxalt prn. Will f/u if no relief with that regimen.       Relevant Medications   rizatriptan (MAXALT) 10 MG tablet       Follow up plan: Return for CPE.

## 2017-09-03 ENCOUNTER — Telehealth: Payer: Self-pay | Admitting: Family Medicine

## 2017-09-03 NOTE — Telephone Encounter (Signed)
Copied from CRM [id number]. Topic: Quick Communication - Rx Refill/Question >> Sep 03, 2017  3:47 PM Floria RavelingStovall, Shana A wrote: Medication: rizatriptan (MAXALT) 10 MG tablet [[id number]][[id number]]   Has the patient contacted their pharmacy? No   (Agent: If no, request that the patient contact the pharmacy for the refill.)   Preferred Pharmacy (with phone number or street name):  [facility] in [place]    Agent: Please be advised that RX refills may take up to 3 business days. We ask that you follow-up with your pharmacy.

## 2017-09-06 NOTE — Telephone Encounter (Signed)
See note below, call pt if needed

## 2017-09-07 MED ORDER — RIZATRIPTAN BENZOATE 10 MG PO TABS: 10.0000 mg | ORAL_TABLET | ORAL | 3 refills | Status: DC | PRN

## 2017-09-07 NOTE — Telephone Encounter (Signed)
Rx sent 

## 2017-09-21 ENCOUNTER — Telehealth: Payer: Self-pay | Admitting: Family Medicine

## 2017-09-21 MED ORDER — ELETRIPTAN HYDROBROMIDE 40 MG PO TABS: 40.0000 mg | ORAL_TABLET | ORAL | 0 refills | Status: DC | PRN

## 2017-09-21 NOTE — Telephone Encounter (Signed)
Tried calling patient, no answer.  LVM for patient to return phone call. No DPR.

## 2017-09-21 NOTE — Telephone Encounter (Signed)
Copied from CRM [id number]. Topic: General - Other >> Sep 21, 2017  9:13 AM Maia Pettiesrtiz, Kristie S wrote: Reason for CRM: Pt has used all her Maxalt for 30 day period. Insurance covers 10 per 30 days. In the past 2 weeks pt had her period and her mother passed causing her a lot of stress. Pt is asking what can be done for her to either get additional meds or another med for the rest of the month. Please call pt to advise.  [facility] [id number] [place] - [address] [facility] [address] [phone] (Phone) [phone] (Fax)

## 2017-09-21 NOTE — Telephone Encounter (Signed)
Sent over sibling medication relpax to see if this would be covered before her next refill of maxalt is due - if pharmacy can recommend what else will be covered I'm happy to change if this is too expensive

## 2017-09-21 NOTE — Telephone Encounter (Signed)
Pt returned call, spoke with her about getting relpax filled until her next maxalt script is due as she's had a few more migraines lately than usual due to life stress - pt agreeable and understands plan, will let me know if any problems arise

## 2017-10-10 ENCOUNTER — Encounter: Payer: BC Managed Care – PPO | Admitting: Family Medicine

## 2017-10-22 ENCOUNTER — Other Ambulatory Visit: Payer: Self-pay | Admitting: Family Medicine

## 2017-11-20 ENCOUNTER — Telehealth: Payer: Self-pay | Admitting: Family Medicine

## 2017-11-20 MED ORDER — ELETRIPTAN HYDROBROMIDE 40 MG PO TABS: 40.0000 mg | ORAL_TABLET | ORAL | 0 refills | Status: DC | PRN

## 2017-11-20 NOTE — Telephone Encounter (Signed)
Copied from CRM #[id number]. Topic: Quick Communication - See Telephone Encounter >> Nov 20, 2017 11:22 AM Diana EvesHoyt, Maryann B wrote: CRM for notification. See Telephone encounter for: 11/20/17. Pt is requesting eletriptan (RELPAX) 40 MG tablet [phone] - [place], [place] - [address] [facility] [address]. She is unable to get rizatriptan (MAXALT) 10 MG tablet filled until June 13th.

## 2017-12-05 IMAGING — CT CT HEAD WO/W CM
1 of 2 series · 13 of 30 positions shown, 17 images · IV contrast (omnipaque)
Comparison: None.

CLINICAL DATA: Patient c/o Left frontal H/A's radiating from Left
orbital area to Neck and Left posterior head area worsening x 1
year. She states she Shobha Kwiatkowski up with the pain and then it goes
away. She states it happens once a month, and last 2-3 days.

EXAM:
CT HEAD WITHOUT AND WITH CONTRAST
TECHNIQUE: Contiguous axial images were obtained from the base of the skull
through the vertex without and with intravenous contrast
CONTRAST:  75mL OMNIPAQUE IOHEXOL 300 MG/ML  SOLN

[Series 2: head wo · axial · 0.41mm/px · z∈[-100,+20]mm · 13 of 30 slices shown, 17 images]
[im 3/30  brain]
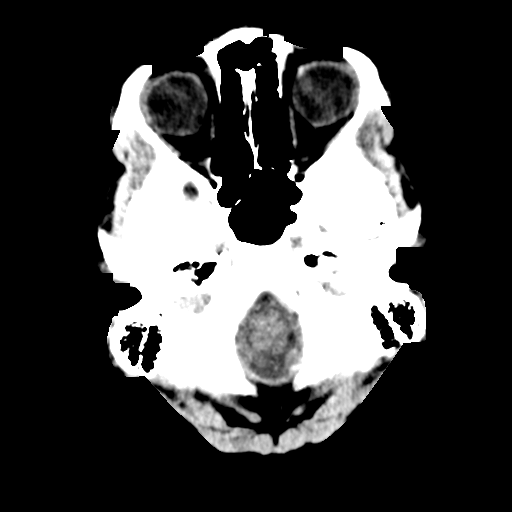
[im 3/30  bone]
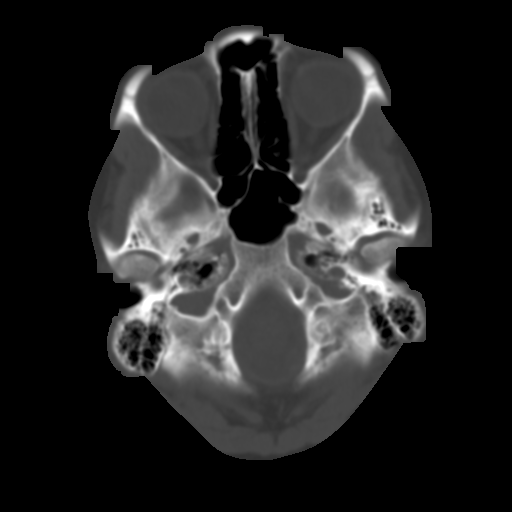
[im 5/30  brain]
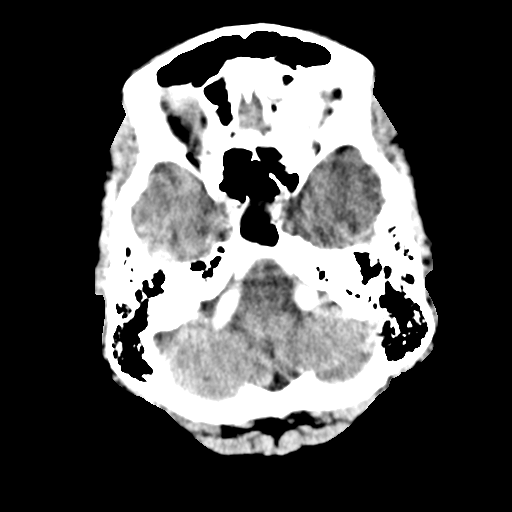
[im 7/30  brain]
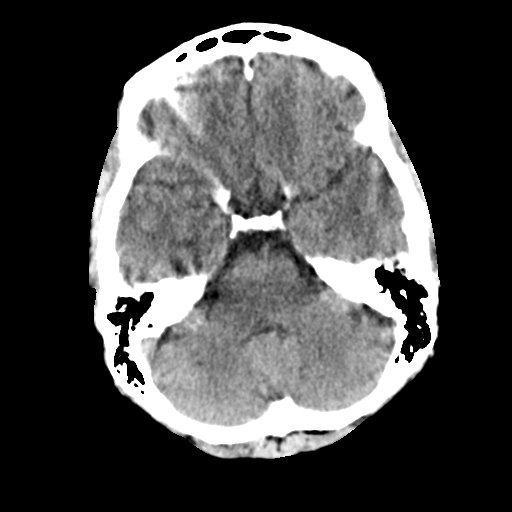
[im 9/30  brain]
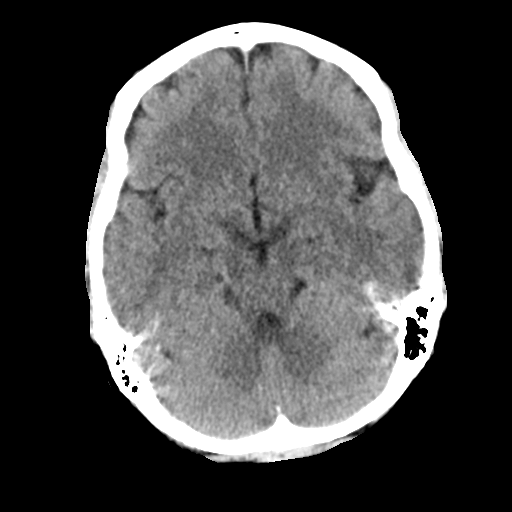
[im 11/30  brain]
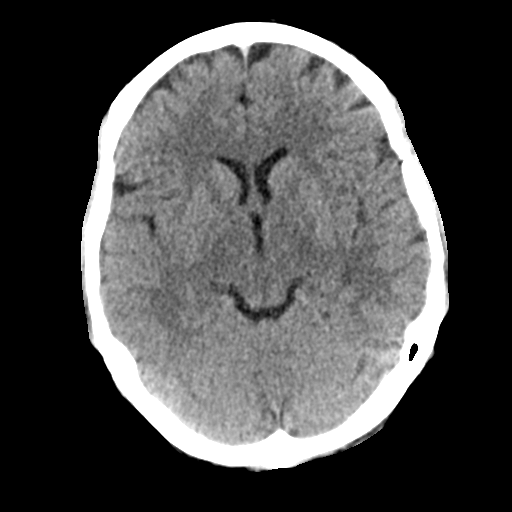
[im 11/30  bone]
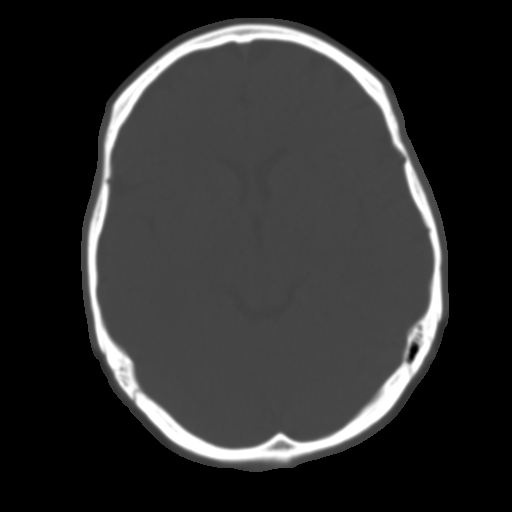
[im 13/30  brain]
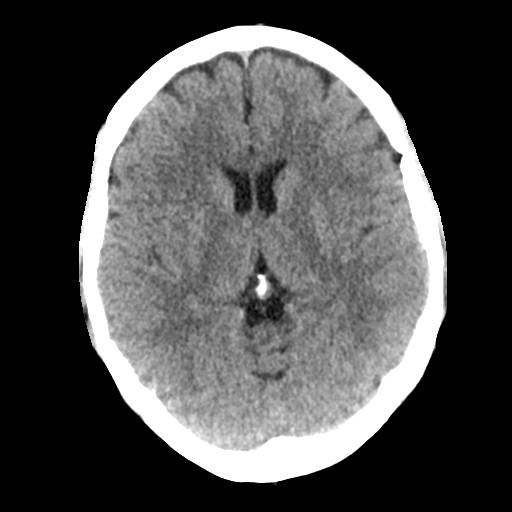
[im 15/30  brain]
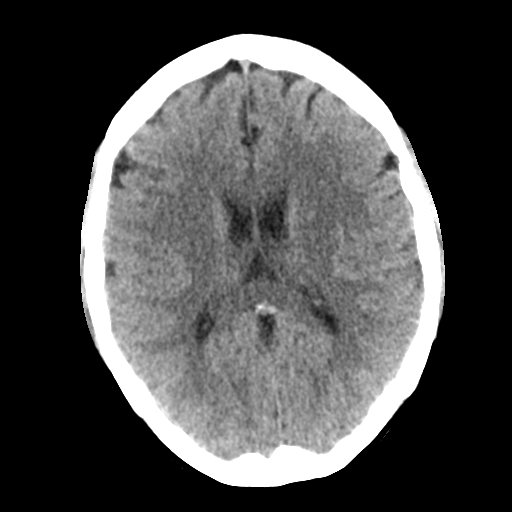
[im 17/30  brain]
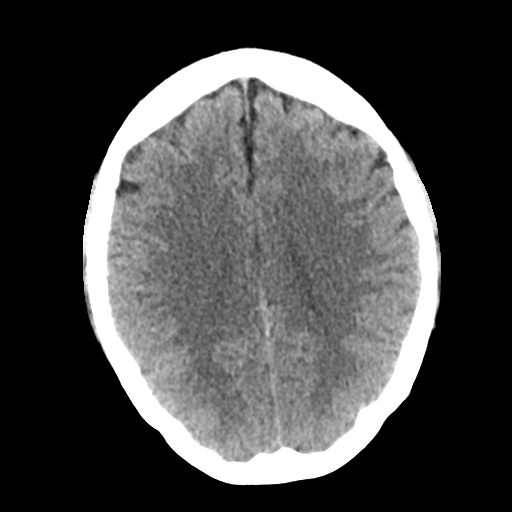
[im 19/30  brain]
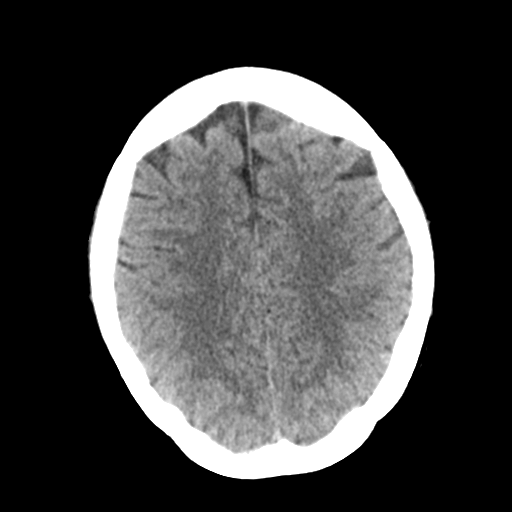
[im 19/30  bone]
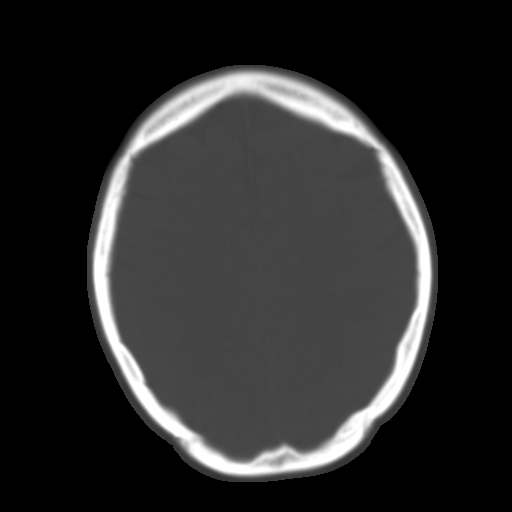
[im 21/30  brain]
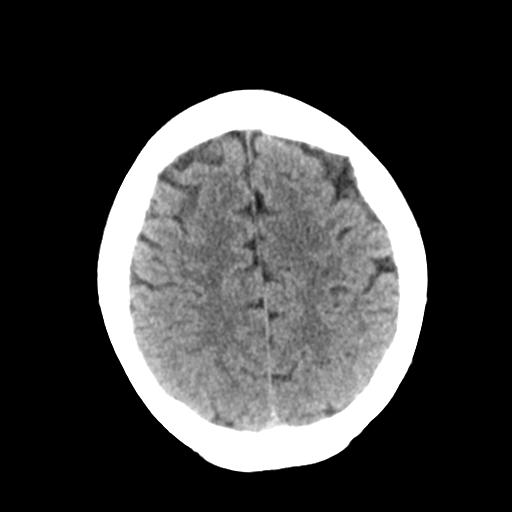
[im 23/30  brain]
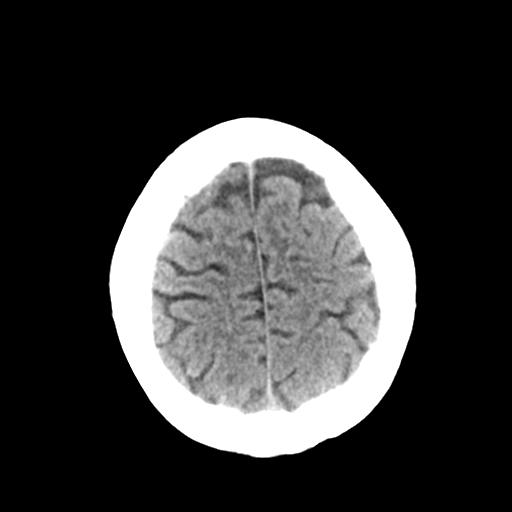
[im 25/30  brain]
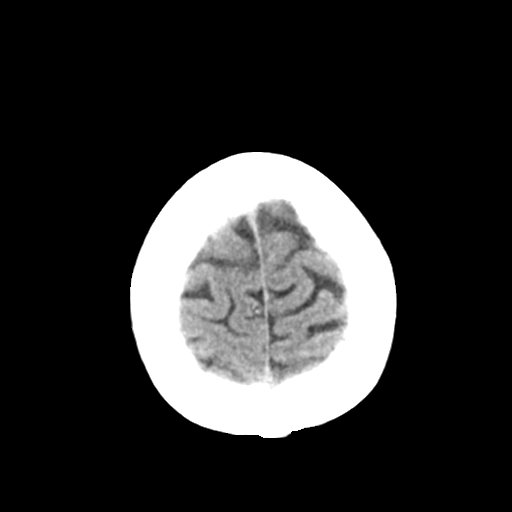
[im 27/30  brain]
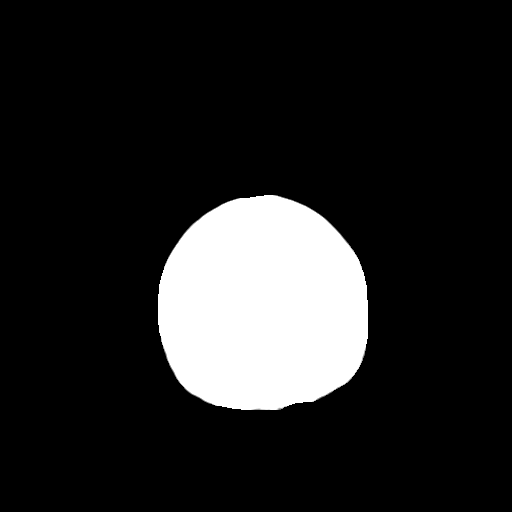
[im 27/30  bone]
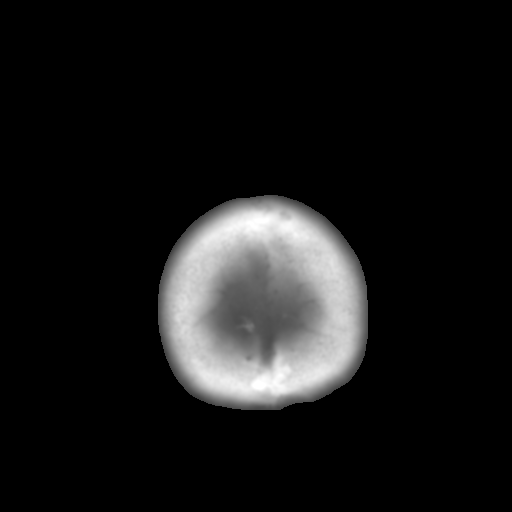

[13 of 30 positions shown; findings below may reference images not displayed]

FINDINGS: No evidence for acute infarction, hemorrhage, mass lesion,
hydrocephalus, or extra-axial fluid. No atrophy or white matter
disease. Intact calvarium. No acute sinus or mastoid disease.

Post infusion, no abnormal enhancement of the brain or meninges.
Extracranial soft tissues appear unremarkable.
IMPRESSION: Negative CT of the head without contrast.

## 2017-12-10 ENCOUNTER — Encounter: Payer: BC Managed Care – PPO | Admitting: Family Medicine

## 2017-12-12 ENCOUNTER — Other Ambulatory Visit: Payer: Self-pay | Admitting: Family Medicine

## 2017-12-13 ENCOUNTER — Other Ambulatory Visit: Payer: Self-pay

## 2017-12-13 ENCOUNTER — Emergency Department: Payer: BC Managed Care – PPO

## 2017-12-13 ENCOUNTER — Emergency Department
Admission: EM | Admit: 2017-12-13 | Discharge: 2017-12-13 | Disposition: A | Discharge status: Home / Self Care | Payer: BC Managed Care – PPO | Attending: Emergency Medicine | Admitting: Emergency Medicine

## 2017-12-13 DIAGNOSIS — Y998 Other external cause status: Secondary | ICD-10-CM | POA: Diagnosis not present

## 2017-12-13 DIAGNOSIS — Y9301 Activity, walking, marching and hiking: Secondary | ICD-10-CM | POA: Insufficient documentation

## 2017-12-13 DIAGNOSIS — M545 Low back pain, unspecified: Secondary | ICD-10-CM

## 2017-12-13 DIAGNOSIS — T148XXA Other injury of unspecified body region, initial encounter: Secondary | ICD-10-CM | POA: Diagnosis not present

## 2017-12-13 DIAGNOSIS — W108XXA Fall (on) (from) other stairs and steps, initial encounter: Secondary | ICD-10-CM | POA: Insufficient documentation

## 2017-12-13 DIAGNOSIS — W19XXXA Unspecified fall, initial encounter: Secondary | ICD-10-CM

## 2017-12-13 DIAGNOSIS — Z79899 Other long term (current) drug therapy: Secondary | ICD-10-CM | POA: Diagnosis not present

## 2017-12-13 DIAGNOSIS — Y92008 Other place in unspecified non-institutional (private) residence as the place of occurrence of the external cause: Secondary | ICD-10-CM | POA: Diagnosis not present

## 2017-12-13 LAB — CBC
HEMATOCRIT: 34.2 % — AB (ref 35.0–47.0)
HEMOGLOBIN: 11.9 g/dL — AB (ref 12.0–16.0)
MCH: 31.4 pg (ref 26.0–34.0)
MCHC: 34.6 g/dL (ref 32.0–36.0)
MCV: 90.8 fL (ref 80.0–100.0)
Platelets: 222 10*3/uL (ref 150–440)
RBC: 3.77 MIL/uL — ABNORMAL LOW (ref 3.80–5.20)
RDW: 14.2 % (ref 11.5–14.5)
WBC: 6.3 10*3/uL (ref 3.6–11.0)

## 2017-12-13 LAB — COMPREHENSIVE METABOLIC PANEL
ALBUMIN: 3.5 g/dL (ref 3.5–5.0)
ALK PHOS: 26 U/L — AB (ref 38–126)
ALT: 14 U/L (ref 0–44)
AST: 17 U/L (ref 15–41)
Anion gap: 7 (ref 5–15)
BILIRUBIN TOTAL: 0.2 mg/dL — AB (ref 0.3–1.2)
BUN: 18 mg/dL (ref 6–20)
CALCIUM: 8.5 mg/dL — AB (ref 8.9–10.3)
CO2: 23 mmol/L (ref 22–32)
CREATININE: 0.64 mg/dL (ref 0.44–1.00)
Chloride: 109 mmol/L (ref 98–111)
GFR calc Af Amer: >60 mL/min (ref >60)
GFR calc non Af Amer: >60 mL/min (ref >60)
GLUCOSE: 128 mg/dL — AB (ref 70–99)
POTASSIUM: 4.2 mmol/L (ref 3.5–5.1)
Sodium: 139 mmol/L (ref 135–145)
TOTAL PROTEIN: 6.1 g/dL — AB (ref 6.5–8.1)

## 2017-12-13 LAB — HCG, QUANTITATIVE, PREGNANCY: hCG, Beta Chain, Quant, S: <1 m[IU]/mL (ref <5)

## 2017-12-13 MED ORDER — CARISOPRODOL 350 MG PO TABS: 350.0000 mg | ORAL_TABLET | Freq: Three times a day (TID) | ORAL | 0 refills | Status: DC | PRN

## 2017-12-13 MED ORDER — MORPHINE SULFATE (PF) 4 MG/ML IV SOLN
INTRAVENOUS | Status: AC
  Administered 2017-12-13: 4 mg via INTRAVENOUS
  Filled 2017-12-13: qty 1

## 2017-12-13 MED ORDER — IOPAMIDOL (ISOVUE-370) INJECTION 76%
100.0000 mL | Freq: Once | INTRAVENOUS | Status: AC | PRN
  Administered 2017-12-13: 100 mL via INTRAVENOUS
  Filled 2017-12-13: qty 100

## 2017-12-13 MED ORDER — SODIUM CHLORIDE 0.9 % IV BOLUS
1000.0000 mL | Freq: Once | INTRAVENOUS | Status: AC
  Administered 2017-12-13: 1000 mL via INTRAVENOUS

## 2017-12-13 MED ORDER — ONDANSETRON HCL 4 MG/2ML IJ SOLN
4.0000 mg | Freq: Once | INTRAMUSCULAR | Status: AC
  Administered 2017-12-13: 4 mg via INTRAVENOUS

## 2017-12-13 MED ORDER — ONDANSETRON HCL 4 MG/2ML IJ SOLN
INTRAMUSCULAR | Status: AC
  Filled 2017-12-13: qty 2

## 2017-12-13 MED ORDER — MORPHINE SULFATE (PF) 4 MG/ML IV SOLN
4.0000 mg | Freq: Once | INTRAVENOUS | Status: AC
  Administered 2017-12-13: 4 mg via INTRAVENOUS

## 2017-12-13 NOTE — ED Notes (Signed)
E-signature pad unavailable at this time.  Patient verbalized understanding of discharge instructions.

## 2017-12-13 NOTE — ED Triage Notes (Signed)
Pt arrived via EMS from home d/t fall down 5 wooden steps while trying to take her dog outside. Pt has abrasion to her lower back and reports passing out for a short period of time d/t the pain. Pt states it is normal to pass out if she is in pain. Pt denies hitting her head or neck. Pt has hx of C[facility]. Pt is A&O x4 at this time.

## 2017-12-13 NOTE — ED Provider Notes (Signed)
[facility] Emergency Department Provider Note ____________________________________________   First MD Initiated Contact with Patient 12/13/17 2034     (approximate)  I have reviewed the triage vital signs and the nursing notes.   HISTORY  Chief Complaint Fall   HPI Ruth Hammond is a 47 y.o. female with a history of anxiety and depression was presenting to the emergency department today after a fall down 5 steps.  Says that she was walking down steps and tripped over her dog.  Says that she slid down the steps on her backside and left flank.  EMS was called and she was given 100 mcg of fentanyl.  Says that she did pass out but this is been a response she has had previously.  She is not reporting any chest pain, shortness of breath.  Denies hitting her head or neck or losing consciousness.  No pain in her head or neck.  She was nauseous and red but is feeling less nauseous at this time.  Says that her last tetanus shot was proximately 5 years ago.  Denies any weakness or numbness to the bilateral lower extremity's.  However, when she raises her right leg says that it hurts in the left side of her back.  Does not report any loss of bowel or bladder continence.  Denies being on any blood thinners.  Past Medical History:  Diagnosis Date  . Anxiety   . Depression     Patient Active Problem List   Diagnosis Date Noted  . Migraine 08/06/2017  . Well woman exam 01/02/2016  . Chronic left-sided headaches 06/15/2015  . Left cervical radiculopathy 06/07/2015  . GERD (gastroesophageal reflux disease) 01/08/2015  . Anxiety   . Depression     Past Surgical History:  Procedure Laterality Date  . DILATION AND CURETTAGE OF UTERUS    . neck fusion  10/2015  . TUBAL LIGATION    . WISDOM TOOTH EXTRACTION      Prior to Admission medications   Medication Sig Start Date End Date Taking? Authorizing Provider  eletriptan (RELPAX) 40 MG tablet Take 1 tablet (40 mg  total) by mouth as needed for migraine or headache. May repeat in 2 hours if headache persists or recurs. 11/20/17   Olevia Perches P, DO  Multiple Vitamin (MULTIVITAMIN) tablet Take 1 tablet by mouth daily.    [provider]  rizatriptan (MAXALT) 10 MG tablet Take 1 tablet (10 mg total) by mouth as needed for migraine. May repeat in 2 hours if needed 09/07/17   Particia Nearing, PA-C  triamcinolone ointment (KENALOG) 0.5 % Apply 1 application topically 2 (two) times daily. 06/27/17   Particia Nearing, PA-C  venlafaxine (EFFEXOR) 37.5 MG tablet Take 1 tablet (37.5 mg total) by mouth 2 (two) times daily. 06/27/17   Particia Nearing, PA-C  venlafaxine (EFFEXOR) 37.5 MG tablet TAKE 1 TABLET(37.5 MG) BY MOUTH TWICE DAILY 10/22/17   Olevia Perches P, DO    Allergies Patient has no known allergies.  Family History  Problem Relation Age of Onset  . Hyperlipidemia Mother   . Hypertension Mother   . Stroke Mother   . Diabetes Mother   . Heart disease Mother   . Cancer Father        bone  . Hyperlipidemia Sister   . Hypertension Sister   . Stroke Sister   . Cancer Maternal Grandmother        ovarian or cervical not sure  . Breast cancer  Neg Hx     Social History Social History   Tobacco Use  . Smoking status: Never Smoker  . Smokeless tobacco: Never Used  Substance Use Topics  . Alcohol use: No  . Drug use: No    Review of Systems  Constitutional: No fever/chills Eyes: No visual changes. ENT: No sore throat. Cardiovascular: Denies chest pain. Respiratory: Denies shortness of breath. Gastrointestinal: No abdominal pain.  No nausea, no vomiting.  No diarrhea.  No constipation. Genitourinary: Negative for dysuria. Musculoskeletal: As above Skin: Negative for rash. Neurological: Negative for headaches, focal weakness or numbness. ____________________________________________   PHYSICAL EXAM:  VITAL SIGNS: ED Triage Vitals  Enc Vitals Group     BP  12/13/17 2042 122/77     Pulse Rate 12/13/17 2042 67     Resp 12/13/17 2042 (!) 21     Temp 12/13/17 2042 97.8 F (36.6 C)     Temp Source 12/13/17 2042 Oral     SpO2 12/13/17 2042 100 %     Weight 12/13/17 2043 150 lb (68 kg)     Height 12/13/17 2043 5\' 7"  (1.702 m)     Head Circumference --      Peak Flow --      Pain Score 12/13/17 2043 7     Pain Loc --      Pain Edu? --      Excl. in GC? --     Constitutional: Alert and oriented.  Appears uncomfortable. Eyes: Conjunctivae are normal.  Head: Atraumatic. Nose: No congestion/rhinnorhea. Mouth/Throat: Mucous membranes are moist.  Neck: No stridor.  No tenderness to palpation to the midline cervical spine.  No deformity or step-off. Cardiovascular: Normal rate, regular rhythm. Grossly normal heart sounds.   Respiratory: Normal respiratory effort.  No retractions. Lungs CTAB. Gastrointestinal: Soft and nontender. No distention. No CVA tenderness. Musculoskeletal: No lower extremity tenderness nor edema.  No joint effusions.  Left lumbar region with abrasion, laterally.  No active bleeding.  Tenderness to palpation to the left lateral lumbar region.  Minimal midline tenderness to palpation.  No thoracic tenderness to palpation.  5 out of 5 strength to the left lower extremity.  Right lower extremity with 5 out of 5 strength but with pain to the left lower back in the lumbar region with flexion of the right hip.  Pelvis is stable.  No tenderness to palpation to the bilateral hips.  Neurovascularly intact the bilateral lower extremities with light touch sensation intact and bilateral and equal dorsalis pedis pulses.  Neurologic:  Normal speech and language. No gross focal neurologic deficits are appreciated. Skin:  Skin is warm, dry and intact. No rash noted. Psychiatric: Mood and affect are normal. Speech and behavior are normal.  ____________________________________________   LABS (all labs ordered are listed, but only abnormal  results are displayed)  Labs Reviewed  CBC - Abnormal; Notable for the following components:      Result Value   RBC 3.77 (*)    Hemoglobin 11.9 (*)    HCT 34.2 (*)    All other components within normal limits  COMPREHENSIVE METABOLIC PANEL - Abnormal; Notable for the following components:   Glucose, Bld 128 (*)    Calcium 8.5 (*)    Total Protein 6.1 (*)    Alkaline Phosphatase 26 (*)    Total Bilirubin 0.2 (*)    All other components within normal limits  HCG, QUANTITATIVE, PREGNANCY  POC URINE PREG, ED   ____________________________________________  EKG   ____________________________________________  RADIOLOGY  No focal no acute fracture or internal injury identified on CT.  Focal liver steatosis with a small para umbilical cyst containing fat. ____________________________________________   PROCEDURES  Procedure(s) performed:   Procedures  Critical Care performed:   ____________________________________________   INITIAL IMPRESSION / ASSESSMENT AND PLAN / ED COURSE  Pertinent labs & imaging results that were available during my care of the patient were reviewed by me and considered in my medical decision making (see chart for details).  DDX: Sacral fracture, hip fracture, lumbar spine fracture, abrasion, contusion, kidney injury As part of my medical decision making, I reviewed the following data within the electronic MEDICAL RECORD NUMBEROutpatient records reviewed  ----------------------------------------- 11:18 PM on 12/13/2017 -----------------------------------------  Patient at this time says her pain is relieved after morphine.  Reassuring CAT scan without serious internal injury identified.  Patient updated about the diagnosis as well as the treatment plan.  I recommended that she take NSAIDs at home as well as ice injury.  We discussed that she will likely be very bruised and sore over the next few days.  She is understanding the treatment plan willing to  comply. ____________________________________________   FINAL CLINICAL IMPRESSION(S) / ED DIAGNOSES  Fall.  Abrasion.  Back pain.    NEW MEDICATIONS STARTED DURING THIS VISIT:  New Prescriptions   No medications on file     Note:  This document was prepared using Dragon voice recognition software and may include unintentional dictation errors.     Myrna BlazerSchaevitz, Stephanny Tsutsui Matthew, MD 12/13/17 [phone]

## 2017-12-14 NOTE — Telephone Encounter (Signed)
Interface request refill for Maxxalt  10mg     06/27/17 B/P 120/79 2/18/19102/70  Lov 08/06/17 with Roosvelt MaserLane Rachel    Last refill:321/19     Pharmacy [facility] [place]

## 2017-12-16 ENCOUNTER — Other Ambulatory Visit: Payer: Self-pay | Admitting: Family Medicine

## 2017-12-17 NOTE — Telephone Encounter (Signed)
Interface request refill for replax    LOV 08/06/17 with  Roosvelt Maserachel Lane   Last refill:  11/20/17  Phamacy [facility] [phone]

## 2018-01-03 ENCOUNTER — Other Ambulatory Visit: Payer: Self-pay | Admitting: Family Medicine

## 2018-01-04 NOTE — Telephone Encounter (Signed)
Maxalt refill Last OV:08/06/17 Last refill:12/14/17 10 tab/0 refill ZOX:[id number]:Lane [phone] [place] - [address] [facility] [address] [phone] (Phone) [phone])

## 2018-01-07 ENCOUNTER — Ambulatory Visit (INDEPENDENT_AMBULATORY_CARE_PROVIDER_SITE_OTHER): Payer: BC Managed Care – PPO | Admitting: Family Medicine

## 2018-01-07 ENCOUNTER — Other Ambulatory Visit: Payer: Self-pay

## 2018-01-07 ENCOUNTER — Encounter: Payer: Self-pay | Admitting: Family Medicine

## 2018-01-07 VITALS — BP 107/72 | HR 91 | Temp 98.0°F | Ht 65.75 in | Wt 155.3 lb

## 2018-01-07 DIAGNOSIS — G43909 Migraine, unspecified, not intractable, without status migrainosus: Secondary | ICD-10-CM | POA: Diagnosis not present

## 2018-01-07 DIAGNOSIS — F330 Major depressive disorder, recurrent, mild: Secondary | ICD-10-CM | POA: Diagnosis not present

## 2018-01-07 DIAGNOSIS — Z1231 Encounter for screening mammogram for malignant neoplasm of breast: Secondary | ICD-10-CM | POA: Diagnosis not present

## 2018-01-07 DIAGNOSIS — Z7189 Other specified counseling: Secondary | ICD-10-CM | POA: Diagnosis not present

## 2018-01-07 DIAGNOSIS — Z0000 Encounter for general adult medical examination without abnormal findings: Secondary | ICD-10-CM

## 2018-01-07 DIAGNOSIS — Z1239 Encounter for other screening for malignant neoplasm of breast: Secondary | ICD-10-CM

## 2018-01-07 DIAGNOSIS — Z0001 Encounter for general adult medical examination with abnormal findings: Secondary | ICD-10-CM

## 2018-01-07 DIAGNOSIS — Z7184 Encounter for health counseling related to travel: Secondary | ICD-10-CM

## 2018-01-07 LAB — UA/M W/RFLX CULTURE, ROUTINE
Bilirubin, UA: NEGATIVE
Glucose, UA: NEGATIVE
Ketones, UA: NEGATIVE
Leukocytes, UA: NEGATIVE
NITRITE UA: NEGATIVE
Protein, UA: NEGATIVE
RBC, UA: NEGATIVE
Specific Gravity, UA: 1.01 (ref 1.005–1.030)
UUROB: 0.2 mg/dL (ref 0.2–1.0)
pH, UA: 7.5 (ref 5.0–7.5)

## 2018-01-07 MED ORDER — PROMETHAZINE HCL 25 MG PO TABS: 25.0000 mg | ORAL_TABLET | Freq: Three times a day (TID) | ORAL | 0 refills | Status: DC | PRN

## 2018-01-07 MED ORDER — NITROFURANTOIN MONOHYD MACRO 100 MG PO CAPS: 100.0000 mg | ORAL_CAPSULE | Freq: Two times a day (BID) | ORAL | 0 refills | Status: DC

## 2018-01-07 MED ORDER — RIZATRIPTAN BENZOATE 10 MG PO TABS: ORAL_TABLET | ORAL | 6 refills | Status: DC

## 2018-01-07 NOTE — Progress Notes (Signed)
BP 107/72   Pulse 91   Temp 98 F (36.7 C) (Oral)   Ht 5' 5.75" (1.67 m)   Wt 155 lb 4.8 oz (70.4 kg)   SpO2 98%   BMI 25.26 kg/m    Subjective:    Patient ID: Ruth Hammond, female    DOB: 08/06/1970, 47 y.o.   MRN: [id number]  HPI: Ruth Hammond is a 47 y.o. female presenting on 01/07/2018 for comprehensive medical examination. Current medical complaints include:see below  ER visit for a fall down the stairs. Had some significant low back pain. Went to [facility] who did previous surgeries who did imaging and found it was a bad contusion. Had muscle relaxers and prednisone with some relief. Feeling much better.   Migraines under good control with prn maxalt.   Traveling out of the country soon, needing typhoid vaccine rx.   Menopausal Symptoms: no  Depression Screen done today and results listed below:  Depression screen [facility] 2/9 01/07/2018 05/18/2016  Decreased Interest 0 0  Down, Depressed, Hopeless 0 0  PHQ - 2 Score 0 0  Altered sleeping 0 0  Tired, decreased energy 0 0  Change in appetite 0 0  Feeling bad or failure about yourself  0 0  Trouble concentrating 0 0  Moving slowly or fidgety/restless 0 0  Suicidal thoughts 0 0  PHQ-9 Score 0 0    The patient does not have a history of falls. I did not complete a risk assessment for falls. A plan of care for falls was not documented.   Past Medical History:  Past Medical History:  Diagnosis Date  . Anxiety   . Depression     Surgical History:  Past Surgical History:  Procedure Laterality Date  . DILATION AND CURETTAGE OF UTERUS    . neck fusion  10/2015  . TUBAL LIGATION    . WISDOM TOOTH EXTRACTION      Medications:  Current Outpatient Medications on File Prior to Visit  Medication Sig  . carisoprodol (SOMA) 350 MG tablet Take 1 tablet (350 mg total) by mouth 3 (three) times daily as needed.  Marland Kitchen ibuprofen (ADVIL,MOTRIN) 100 MG/5ML suspension Take 200 mg by mouth daily.  . Multiple Vitamin (MULTIVITAMIN)  tablet Take 1 tablet by mouth daily.  Marland Kitchen venlafaxine (EFFEXOR) 37.5 MG tablet Take 1 tablet (37.5 mg total) by mouth 2 (two) times daily.  Marland Kitchen triamcinolone ointment (KENALOG) 0.5 % Apply 1 application topically 2 (two) times daily. (Patient not taking: Reported on 01/07/2018)   No current facility-administered medications on file prior to visit.     Allergies:  No Known Allergies  Social History:  Social History   Socioeconomic History  . Marital status: Married    Spouse name: Not on file  . Number of children: Not on file  . Years of education: Not on file  . Highest education level: Not on file  Occupational History  . Not on file  Social Needs  . Financial resource strain: Not on file  . Food insecurity:    Worry: Not on file    Inability: Not on file  . Transportation needs:    Medical: Not on file    Non-medical: Not on file  Tobacco Use  . Smoking status: Never Smoker  . Smokeless tobacco: Never Used  Substance and Sexual Activity  . Alcohol use: No  . Drug use: No  . Sexual activity: Not on file  Lifestyle  . Physical activity:  Days per week: Not on file    Minutes per session: Not on file  . Stress: Not on file  Relationships  . Social connections:    Talks on phone: Not on file    Gets together: Not on file    Attends religious service: Not on file    Active member of club or organization: Not on file    Attends meetings of clubs or organizations: Not on file    Relationship status: Not on file  . Intimate partner violence:    Fear of current or ex partner: Not on file    Emotionally abused: Not on file    Physically abused: Not on file    Forced sexual activity: Not on file  Other Topics Concern  . Not on file  Social History Narrative  . Not on file   Social History   Tobacco Use  Smoking Status Never Smoker  Smokeless Tobacco Never Used   Social History   Substance and Sexual Activity  Alcohol Use No    Family History:  Family  History  Problem Relation Age of Onset  . Hyperlipidemia Mother   . Hypertension Mother   . Stroke Mother   . Diabetes Mother   . Heart disease Mother   . Cancer Father        bone  . Hyperlipidemia Sister   . Hypertension Sister   . Stroke Sister   . Cancer Maternal Grandmother        ovarian or cervical not sure  . Breast cancer Neg Hx     Past medical history, surgical history, medications, allergies, family history and social history reviewed with patient today and changes made to appropriate areas of the chart.   Review of Systems - General ROS: negative Psychological ROS: negative Ophthalmic ROS: negative ENT ROS: negative Allergy and Immunology ROS: negative Breast ROS: negative for breast lumps Respiratory ROS: no cough, shortness of breath, or wheezing Cardiovascular ROS: no chest pain or dyspnea on exertion Gastrointestinal ROS: no abdominal pain, change in bowel habits, or black or bloody stools Genito-Urinary ROS: no dysuria, trouble voiding, or hematuria Musculoskeletal ROS: negative Neurological ROS: positive for - headaches Dermatological ROS: negative All other ROS negative except what is listed above and in the HPI.      Objective:    BP 107/72   Pulse 91   Temp 98 F (36.7 C) (Oral)   Ht 5' 5.75" (1.67 m)   Wt 155 lb 4.8 oz (70.4 kg)   SpO2 98%   BMI 25.26 kg/m   Wt Readings from Last 3 Encounters:  01/07/18 155 lb 4.8 oz (70.4 kg)  12/13/17 150 lb (68 kg)  08/06/17 150 lb 12.8 oz (68.4 kg)    Physical Exam  Results for orders placed or performed in visit on 01/07/18  CBC with Differential/Platelet  Result Value Ref Range   WBC 5.8 3.4 - 10.8 x10E3/uL   RBC 4.16 3.77 - 5.28 x10E6/uL   Hemoglobin 11.9 11.1 - 15.9 g/dL   Hematocrit 16.138.1 09.634.0 - 46.6 %   MCV 92 79 - 97 fL   MCH 28.6 26.6 - 33.0 pg   MCHC 31.2 (L) 31.5 - 35.7 g/dL   RDW 04.514.1 40.912.3 - 81.115.4 %   Platelets 298 150 - 450 x10E3/uL   Neutrophils 73 Not Estab. %   Lymphs 18 Not  Estab. %   Monocytes 6 Not Estab. %   Eos 2 Not Estab. %   Basos 1  Not Estab. %   Neutrophils Absolute 4.2 1.4 - 7.0 x10E3/uL   Lymphocytes Absolute 1.1 0.7 - 3.1 x10E3/uL   Monocytes Absolute 0.4 0.1 - 0.9 x10E3/uL   EOS (ABSOLUTE) 0.1 0.0 - 0.4 x10E3/uL   Basophils Absolute 0.1 0.0 - 0.2 x10E3/uL   Immature Granulocytes 0 Not Estab. %   Immature Grans (Abs) 0.0 0.0 - 0.1 x10E3/uL  Comprehensive metabolic panel  Result Value Ref Range   Glucose 88 65 - 99 mg/dL   BUN 13 6 - 24 mg/dL   Creatinine, Ser 1.61 0.57 - 1.00 mg/dL   GFR calc non Af Amer 90 >59 mL/min/1.73   GFR calc Af Amer 104 >59 mL/min/1.73   BUN/Creatinine Ratio 17 9 - 23   Sodium 140 134 - 144 mmol/L   Potassium 4.4 3.5 - 5.2 mmol/L   Chloride 100 96 - 106 mmol/L   CO2 24 20 - 29 mmol/L   Calcium 10.0 8.7 - 10.2 mg/dL   Total Protein 6.4 6.0 - 8.5 g/dL   Albumin 4.3 3.5 - 5.5 g/dL   Globulin, Total 2.1 1.5 - 4.5 g/dL   Albumin/Globulin Ratio 2.0 1.2 - 2.2   Bilirubin Total 0.4 0.0 - 1.2 mg/dL   Alkaline Phosphatase 42 39 - 117 IU/L   AST 17 0 - 40 IU/L   ALT 23 0 - 32 IU/L  Lipid Panel w/o Chol/HDL Ratio  Result Value Ref Range   Cholesterol, Total 174 100 - 199 mg/dL   Triglycerides 66 0 - 149 mg/dL   HDL 62 >09 mg/dL   VLDL Cholesterol Cal 13 5 - 40 mg/dL   LDL Calculated 99 0 - 99 mg/dL  TSH  Result Value Ref Range   TSH 2.240 0.450 - 4.500 uIU/mL  UA/M w/rflx Culture, Routine  Result Value Ref Range   Specific Gravity, UA 1.010 1.005 - 1.030   pH, UA 7.5 5.0 - 7.5   Color, UA Yellow Yellow   Appearance Ur Hazy (A) Clear   Leukocytes, UA Negative Negative   Protein, UA Negative Negative/Trace   Glucose, UA Negative Negative   Ketones, UA Negative Negative   RBC, UA Negative Negative   Bilirubin, UA Negative Negative   Urobilinogen, Ur 0.2 0.2 - 1.0 mg/dL   Nitrite, UA Negative Negative      Assessment & Plan:   Problem List Items Addressed This Visit      Cardiovascular and Mediastinum     Migraine - Primary    Under good control, continue current regimen      Relevant Medications   ibuprofen (ADVIL,MOTRIN) 100 MG/5ML suspension   rizatriptan (MAXALT) 10 MG tablet     Other   Depression    Stable on effexor. Continue current regimen       Other Visit Diagnoses    Annual physical exam       Relevant Orders   CBC with Differential/Platelet (Completed)   Comprehensive metabolic panel (Completed)   Lipid Panel w/o Chol/HDL Ratio (Completed)   TSH (Completed)   UA/M w/rflx Culture, Routine (Completed)   Screening for breast cancer       Mammogram order placed   Relevant Orders   MM DIGITAL SCREENING BILATERAL   Counseling for travel       Typhoid vaccine capsules sent. Macrobid and phenergan sent in case needed while out of country.        Follow up plan: Return in about 1 year (around 01/08/2019) for CPE.   LABORATORY TESTING:  -  Pap smear: up to date  IMMUNIZATIONS:   - Tdap: Tetanus vaccination status reviewed: last tetanus booster within 10 years. - Influenza: Postponed to flu season  SCREENING: -Mammogram: Ordered today   PATIENT COUNSELING:   Advised to take 1 mg of folate supplement per day if capable of pregnancy.   Sexuality: Discussed sexually transmitted diseases, partner selection, use of condoms, avoidance of unintended pregnancy  and contraceptive alternatives.   Advised to avoid cigarette smoking.  I discussed with the patient that most people either abstain from alcohol or drink within safe limits (<=14/week and <=4 drinks/occasion for males, <=7/weeks and <= 3 drinks/occasion for females) and that the risk for alcohol disorders and other health effects rises proportionally with the number of drinks per week and how often a drinker exceeds daily limits.  Discussed cessation/primary prevention of drug use and availability of treatment for abuse.   Diet: Encouraged to adjust caloric intake to maintain  or achieve ideal body weight, to  reduce intake of dietary saturated fat and total fat, to limit sodium intake by avoiding high sodium foods and not adding table salt, and to maintain adequate dietary potassium and calcium preferably from fresh fruits, vegetables, and low-fat dairy products.    stressed the importance of regular exercise  Injury prevention: Discussed safety belts, safety helmets, smoke detector, smoking near bedding or upholstery.   Dental health: Discussed importance of regular tooth brushing, flossing, and dental visits.    NEXT PREVENTATIVE PHYSICAL DUE IN 1 YEAR. Return in about 1 year (around 01/08/2019) for CPE.

## 2018-01-07 NOTE — Patient Instructions (Signed)
[facility] - [facility] [address]

## 2018-01-08 ENCOUNTER — Encounter: Payer: Self-pay | Admitting: Family Medicine

## 2018-01-08 ENCOUNTER — Telehealth: Payer: Self-pay | Admitting: Family Medicine

## 2018-01-08 LAB — LIPID PANEL W/O CHOL/HDL RATIO
CHOLESTEROL TOTAL: 174 mg/dL (ref 100–199)
HDL: 62 mg/dL (ref >39)
LDL Calculated: 99 mg/dL (ref 0–99)
TRIGLYCERIDES: 66 mg/dL (ref 0–149)
VLDL Cholesterol Cal: 13 mg/dL (ref 5–40)

## 2018-01-08 LAB — CBC WITH DIFFERENTIAL/PLATELET
Basophils Absolute: 0.1 10*3/uL (ref 0.0–0.2)
Basos: 1 %
EOS (ABSOLUTE): 0.1 10*3/uL (ref 0.0–0.4)
EOS: 2 %
HEMATOCRIT: 38.1 % (ref 34.0–46.6)
Hemoglobin: 11.9 g/dL (ref 11.1–15.9)
Immature Grans (Abs): 0 10*3/uL (ref 0.0–0.1)
Immature Granulocytes: 0 %
LYMPHS ABS: 1.1 10*3/uL (ref 0.7–3.1)
Lymphs: 18 %
MCH: 28.6 pg (ref 26.6–33.0)
MCHC: 31.2 g/dL — ABNORMAL LOW (ref 31.5–35.7)
MCV: 92 fL (ref 79–97)
MONOS ABS: 0.4 10*3/uL (ref 0.1–0.9)
Monocytes: 6 %
Neutrophils Absolute: 4.2 10*3/uL (ref 1.4–7.0)
Neutrophils: 73 %
Platelets: 298 10*3/uL (ref 150–450)
RBC: 4.16 x10E6/uL (ref 3.77–5.28)
RDW: 14.1 % (ref 12.3–15.4)
WBC: 5.8 10*3/uL (ref 3.4–10.8)

## 2018-01-08 LAB — COMPREHENSIVE METABOLIC PANEL
A/G RATIO: 2 (ref 1.2–2.2)
ALT: 23 IU/L (ref 0–32)
AST: 17 IU/L (ref 0–40)
Albumin: 4.3 g/dL (ref 3.5–5.5)
Alkaline Phosphatase: 42 IU/L (ref 39–117)
BUN/Creatinine Ratio: 17 (ref 9–23)
BUN: 13 mg/dL (ref 6–24)
Bilirubin Total: 0.4 mg/dL (ref 0.0–1.2)
CO2: 24 mmol/L (ref 20–29)
Calcium: 10 mg/dL (ref 8.7–10.2)
Chloride: 100 mmol/L (ref 96–106)
Creatinine, Ser: 0.78 mg/dL (ref 0.57–1.00)
GFR calc Af Amer: 104 mL/min/{1.73_m2} (ref >59)
GFR calc non Af Amer: 90 mL/min/{1.73_m2} (ref >59)
GLOBULIN, TOTAL: 2.1 g/dL (ref 1.5–4.5)
Glucose: 88 mg/dL (ref 65–99)
POTASSIUM: 4.4 mmol/L (ref 3.5–5.2)
SODIUM: 140 mmol/L (ref 134–144)
Total Protein: 6.4 g/dL (ref 6.0–8.5)

## 2018-01-08 LAB — TSH: TSH: 2.24 u[IU]/mL (ref 0.450–4.500)

## 2018-01-08 MED ORDER — TYPHOID VACCINE PO CPDR: 1.0000 | DELAYED_RELEASE_CAPSULE | ORAL | 0 refills | Status: DC

## 2018-01-08 NOTE — Telephone Encounter (Signed)
Copied from CRM [id number]. Topic: Quick Communication - See Telephone Encounter >> Jan 08, 2018  9:42 AM Jolayne Hainesaylor, Brittany L wrote: CRM for notification. See Telephone encounter for: 01/08/18.  Patient called and said she inquired about a travel clinic at her appointment yesterday. Patient called [facility] in [place] , the pharmacist said they give either oral med or injection. If Fleet ContrasRachel can give a prescription for the oral, then they could fill it for her. Please advise.

## 2018-01-08 NOTE — Telephone Encounter (Signed)
Rx sent for the oral vaccine

## 2018-01-10 NOTE — Assessment & Plan Note (Signed)
Under good control, continue current regimen 

## 2018-01-10 NOTE — Assessment & Plan Note (Signed)
Stable on effexor. Continue current regimen

## 2018-01-11 NOTE — Telephone Encounter (Signed)
I had sent a letter indicating her normal lab results and also sent a letter regarding her mammogram order and scheduling instructions since I did not review that at her visit. The order has been placed

## 2018-01-11 NOTE — Telephone Encounter (Signed)
Patient was wondering the results of her lab results. I looked at lab results and explained that they all looked normal from what I could see from the computer, but Fleet ContrasRachel would have to go over more for a definite answer.

## 2018-01-11 NOTE — Telephone Encounter (Signed)
Message relayed to patient. Verbalized understanding and denied questions.   

## 2018-01-25 ENCOUNTER — Other Ambulatory Visit: Payer: Self-pay | Admitting: Family Medicine

## 2018-01-25 NOTE — Telephone Encounter (Signed)
Your patient 

## 2018-02-01 LAB — HM MAMMOGRAPHY

## 2018-08-07 ENCOUNTER — Other Ambulatory Visit: Payer: Self-pay | Admitting: Family Medicine

## 2018-08-08 ENCOUNTER — Telehealth: Payer: Self-pay | Admitting: Family Medicine

## 2018-08-08 NOTE — Telephone Encounter (Signed)
D/C'd 01/07/18

## 2018-08-08 NOTE — Telephone Encounter (Signed)
Copied from CRM [id number]. Topic: Quick Communication - Rx Refill/Question >> Aug 08, 2018  1:36 PM Jilda Roche wrote: Medication: eletriptan (RELPAX) 40 MG tablet   Has the patient contacted their pharmacy? Yes.   (Agent: If no, request that the patient contact the pharmacy for the refill.) (Agent: If yes, when and what did the pharmacy advise?) Call office  Preferred Pharmacy (with phone number or street name): [id number] [place], [place] - [address] AT [facility] [address] [phone] (Phone) [phone] (Fax)    Agent: Please be advised that RX refills may take up to 3 business days. We ask that you follow-up with your pharmacy.

## 2018-08-08 NOTE — Telephone Encounter (Signed)
Patient is requesting a medication that is not on medication list- sent for PCP review of request.

## 2018-08-09 MED ORDER — ELETRIPTAN HYDROBROMIDE 40 MG PO TABS: 40.0000 mg | ORAL_TABLET | ORAL | 1 refills | Status: DC | PRN

## 2018-08-09 NOTE — Telephone Encounter (Signed)
She may have meant maxalt - can you confirm? If so, I will get it sent over for her

## 2018-08-09 NOTE — Telephone Encounter (Signed)
Rx sent 

## 2018-08-09 NOTE — Telephone Encounter (Signed)
Patient has eletriptan and maxalt.  Eletriptan is in outside medications. Written by Dr. Laural Benes, last dispensed #2 on 08/07/2018.  Routing to provider.

## 2018-08-18 ENCOUNTER — Other Ambulatory Visit: Payer: Self-pay | Admitting: Family Medicine

## 2018-09-17 ENCOUNTER — Telehealth: Payer: Self-pay

## 2018-09-17 NOTE — Telephone Encounter (Signed)
PA submitted via cover my meds for eletriptan 40mg  tablets. Awaiting approval or denial.

## 2018-09-18 NOTE — Telephone Encounter (Signed)
PA request for Eletriptan was denied.

## 2018-10-17 ENCOUNTER — Telehealth: Payer: Self-pay | Admitting: Family Medicine

## 2018-10-17 NOTE — Telephone Encounter (Signed)
Copied from CRM [id number]. Topic: Quick Communication - See Telephone Encounter >> Oct 17, 2018  5:07 PM Aretta Nip wrote: CRM for notification. See Telephone encounter for: 10/17/18.eletriptan (RELPAX) 40 MG tablet [id number] - [place], [place] - [address] AT [facility] OF [address] [phone] (Phone) [phone] (Fax)  PT requesting refill

## 2018-10-18 MED ORDER — ELETRIPTAN HYDROBROMIDE 40 MG PO TABS: 40.0000 mg | ORAL_TABLET | ORAL | 1 refills | Status: DC | PRN

## 2018-10-18 NOTE — Telephone Encounter (Signed)
Rx sent 

## 2018-12-09 ENCOUNTER — Encounter: Payer: Self-pay | Admitting: Family Medicine

## 2018-12-09 ENCOUNTER — Other Ambulatory Visit: Payer: Self-pay

## 2018-12-09 ENCOUNTER — Telehealth: Payer: Self-pay | Admitting: Family Medicine

## 2018-12-09 ENCOUNTER — Ambulatory Visit (INDEPENDENT_AMBULATORY_CARE_PROVIDER_SITE_OTHER): Payer: BC Managed Care – PPO | Admitting: Family Medicine

## 2018-12-09 DIAGNOSIS — N390 Urinary tract infection, site not specified: Secondary | ICD-10-CM

## 2018-12-09 DIAGNOSIS — R300 Dysuria: Secondary | ICD-10-CM

## 2018-12-09 MED ORDER — NITROFURANTOIN MONOHYD MACRO 100 MG PO CAPS: 100.0000 mg | ORAL_CAPSULE | Freq: Two times a day (BID) | ORAL | 1 refills | Status: DC

## 2018-12-09 NOTE — Telephone Encounter (Signed)
Medication Refill - Medication: nitrofurantoin, macrocrystal-monohydrate, (MACROBID) 100 MG capsule    Has the patient contacted their pharmacy? Yes.   (Agent: If no, request that the patient contact the pharmacy for the refill.) (Agent: If yes, when and what did the pharmacy advise?)  Preferred Pharmacy (with phone number or street name):  [facility] #[id number] [place] He[place], [place] - [address] [place] [phone] (Phone) [phone] (Fax)     Agent: Please be advised that RX refills may take up to 3 business days. We ask that you follow-up with your pharmacy.

## 2018-12-09 NOTE — Progress Notes (Signed)
There were no vitals taken for this visit.   Subjective:    Patient ID: Ruth Hammond, female    DOB: 12-31-1970, 48 y.o.   MRN: [id number]  HPI: Ruth Hammond is a 48 y.o. female  Chief Complaint  Patient presents with  . Urinary Tract Infection    Patient would like a prescription for nitronuratin, she states that she usually takes this for after intercourse, but started to fell a UTI coming on so she finished what she had.     . This visit was completed via WebEx due to the restrictions of the COVID-19 pandemic. All issues as above were discussed and addressed. Physical exam was done as above through visual confirmation on WebEx. If it was felt that the patient should be evaluated in the office, they were directed there. The patient verbally consented to this visit. . Location of the patient: home . Location of the provider: home . Those involved with this call:  . Provider: Merrie Roof, PA-C . CMA: Tiffany Reel, CMA . Front Desk/Registration: Jill Side  . Time spent on call: 15 minutes with patient face to face via video conference. More than 50% of this time was spent in counseling and coordination of care. 5 minutes total spent in review of patient's record and preparation of their chart. I verified patient identity using two factors (patient name and date of birth). Patient consents verbally to being seen via telemedicine visit today.   Dysuria, frequency, bloating x 2 days. Is several days into a course of macrobid she keeps around for post-intercourse prophylaxis but not yet getting relief yet. Denies fever, chills, abdominal pain, N/V/D. Currently out of town at [employer].   Relevant past medical, surgical, family and social history reviewed and updated as indicated. Interim medical history since our last visit reviewed. Allergies and medications reviewed and updated.  Review of Systems  Per HPI unless specifically indicated above     Objective:    There were  no vitals taken for this visit.  Wt Readings from Last 3 Encounters:  01/07/18 155 lb 4.8 oz (70.4 kg)  12/13/17 150 lb (68 kg)  08/06/17 150 lb 12.8 oz (68.4 kg)    Physical Exam Vitals signs and nursing note reviewed.  Constitutional:      General: She is not in acute distress.    Appearance: Normal appearance.  HENT:     Head: Atraumatic.     Right Ear: External ear normal.     Left Ear: External ear normal.     Nose: Nose normal. No congestion.     Mouth/Throat:     Mouth: Mucous membranes are moist.     Pharynx: Oropharynx is clear. No posterior oropharyngeal erythema.  Eyes:     Extraocular Movements: Extraocular movements intact.     Conjunctiva/sclera: Conjunctivae normal.  Neck:     Musculoskeletal: Normal range of motion.  Cardiovascular:     Comments: Unable to assess via virtual visit Pulmonary:     Effort: Pulmonary effort is normal. No respiratory distress.  Musculoskeletal: Normal range of motion.  Skin:    General: Skin is dry.     Findings: No erythema.  Neurological:     Mental Status: She is alert and oriented to person, place, and time.  Psychiatric:        Mood and Affect: Mood normal.        Thought Content: Thought content normal.        Judgment: Judgment  normal.     Results for orders placed or performed in visit on 02/05/18  HM MAMMOGRAPHY  Result Value Ref Range   HM Mammogram 0-4 Bi-Rad 0-4 Bi-Rad, Self Reported Normal      Assessment & Plan:   Problem List Items Addressed This Visit    None    Visit Diagnoses    Dysuria    -  Primary   Will refill macrobid, complete 1 week's worth and if sxs haven't resolved will obtain U/A once she[place] back from the beach next week. Return precautions given   Recurrent UTI       Continue macrobid prn for use after intimacy. Probiotics, good vaginal hygiene reviewed   Relevant Medications   nitrofurantoin, macrocrystal-monohydrate, (MACROBID) 100 MG capsule       Follow up plan: Return if  symptoms worsen or fail to improve.

## 2018-12-09 NOTE — Telephone Encounter (Signed)
Needs appt

## 2018-12-09 NOTE — Telephone Encounter (Signed)
Pt scheduled for Virtual visit this afternoon. FYI only.

## 2018-12-29 ENCOUNTER — Other Ambulatory Visit: Payer: Self-pay | Admitting: Family Medicine

## 2019-01-03 ENCOUNTER — Telehealth: Payer: Self-pay

## 2019-01-03 NOTE — Telephone Encounter (Signed)
Called pt to notified paperwork is ready for pick up. Pt stated she will come by tomorrow to pick them up. Stated do not need to fax over.

## 2019-01-06 ENCOUNTER — Encounter: Payer: Self-pay | Admitting: Family Medicine

## 2019-01-06 ENCOUNTER — Other Ambulatory Visit: Payer: Self-pay

## 2019-01-06 ENCOUNTER — Ambulatory Visit (INDEPENDENT_AMBULATORY_CARE_PROVIDER_SITE_OTHER): Payer: BC Managed Care – PPO | Admitting: Family Medicine

## 2019-01-06 ENCOUNTER — Telehealth: Payer: Self-pay

## 2019-01-06 DIAGNOSIS — R9389 Abnormal findings on diagnostic imaging of other specified body structures: Secondary | ICD-10-CM

## 2019-01-06 NOTE — Telephone Encounter (Signed)
Relpax P.A. done - Key:  Annalee Genta

## 2019-01-06 NOTE — Progress Notes (Signed)
There were no vitals taken for this visit.   Subjective:    Patient ID: Murlean IbaLaura C Black, female    DOB: July 24, 1970, 48 y.o.   MRN: [id number]  HPI: Murlean IbaLaura C Kinoshita is a 48 y.o. female  Chief Complaint  Patient presents with  . Paperwork    . This visit was completed via WebEx due to the restrictions of the COVID-19 pandemic. All issues as above were discussed and addressed. Physical exam was done as above through visual confirmation on WebEx. If it was felt that the patient should be evaluated in the office, they were directed there. The patient verbally consented to this visit. . Location of the patient: home . Location of the provider: home . Those involved with this call:  . Provider: Roosvelt Maserachel Akiah Bauch, PA-C . CMA: Sheilah MinsJada Fox, CMA . Front Desk/Registration: Harriet PhoJoliza Johnson  . Time spent on call: 15 minutes with patient face to face via video conference. More than 50% of this time was spent in counseling and coordination of care. 5 minutes total spent in review of patient's record and preparation of their chart. I verified patient identity using two factors (patient name and date of birth). Patient consents verbally to being seen via telemedicine visit today.   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 Patient aware that she does not have any of the chronic medical conditions that would typically quality one as high risk. 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 Notes she's never had any lung issues since, has always tested negative during routine TB screenings, and per record review as recently as 2011 has had normal chest films taken. Wanting to make sure that she is ok to return to work in person if deemed  appropriate by [employer].   Relevant past medical, surgical, family and social history reviewed and updated as indicated. Interim medical history since our last visit reviewed. Allergies and medications reviewed and updated.  Review of Systems  Per HPI unless specifically indicated above     Objective:    There were no vitals taken for this visit.  Wt Readings from Last 3 Encounters:  01/07/18 155 lb 4.8 oz (70.4 kg)  12/13/17 150 lb (68 kg)  08/06/17 150 lb 12.8 oz (68.4 kg)    Physical Exam Vitals signs and nursing note reviewed.  Constitutional:      General: She is not in acute distress.    Appearance: Normal appearance.  HENT:     Head: Atraumatic.     Right Ear: External ear normal.     Left Ear: External ear normal.     Nose: Nose normal. No congestion.     Mouth/Throat:     Mouth: Mucous membranes are moist.     Pharynx: Oropharynx is clear. No posterior oropharyngeal erythema.  Eyes:     Extraocular Movements: Extraocular movements intact.     Conjunctiva/sclera: Conjunctivae normal.  Neck:     Musculoskeletal: Normal range of motion.  Cardiovascular:     Comments: Unable to assess via virtual visit Pulmonary:     Effort: Pulmonary effort is normal. No respiratory distress.  Musculoskeletal: Normal range of motion.  Skin:    General: Skin is  dry.     Findings: No erythema.  Neurological:     Mental Status: She is alert and oriented to person, place, and time.  Psychiatric:        Mood and Affect: Mood normal.        Thought Content: Thought content normal.        Judgment: Judgment normal.     Results for orders placed or performed in visit on 02/05/18  HM MAMMOGRAPHY  Result Value Ref Range   HM Mammogram 0-4 Bi-Rad 0-4 Bi-Rad, Self Reported Normal      Assessment & Plan:   Problem List Items Addressed This Visit    None    Visit Diagnoses    Abnormal chest x-ray    -  Primary    As an infant, per mother. No available records from this time  but no evidence of lasting damage. Discussed that she does not qualify as high risk based on the available information.    Follow up plan: Return for as scheduled.

## 2019-01-17 ENCOUNTER — Other Ambulatory Visit: Payer: Self-pay | Admitting: Family Medicine

## 2019-01-17 NOTE — Telephone Encounter (Signed)
Requested medication (s) are due for refill today: yes  Requested medication (s) are on the active medication list: yes  Last refill: 10/16/18  Future visit scheduled: No  Notes to clinic:  Pt due depression screen.    Requested Prescriptions  Pending Prescriptions Disp Refills   venlafaxine (EFFEXOR) 37.5 MG tablet [Pharmacy Med Name: VENLAFAXINE 37.5MG  TABLETS] 180 tablet 3    Sig: TAKE 1 TABLET(37.5 MG) BY MOUTH TWICE DAILY     Psychiatry: Antidepressants - SNRI - desvenlafaxine & venlafaxine Failed - 01/17/2019  5:04 PM      Failed - LDL in normal range and within 360 days    LDL Calculated  Date Value Ref Range Status  01/07/2018 99 0 - 99 mg/dL Final         Failed - Total Cholesterol in normal range and within 360 days    Cholesterol, Total  Date Value Ref Range Status  01/07/2018 174 100 - 199 mg/dL Final         Failed - Triglycerides in normal range and within 360 days    Triglycerides  Date Value Ref Range Status  01/07/2018 66 0 - 149 mg/dL Final         Passed - Last BP in normal range    BP Readings from Last 1 Encounters:  01/07/18 107/72         Passed - Valid encounter within last 6 months    Recent Outpatient Visits          1 week ago Abnormal chest x-ray   [facility] [place], [place]   1 month ago [place], [place], [place]   1 year ago Migraine without status migrainosus, not intractable, unspecified migraine type   [facility], [place], [place]   1 year ago Migraine without status migrainosus, not intractable, unspecified migraine type   [facility], [place], [place]   1 year ago Mild episode of recurrent major depressive disorder [facility])   [place], [place], [place]             Passed - Completed PHQ-2 or PHQ-9 in the last 360 days.

## 2019-01-20 ENCOUNTER — Other Ambulatory Visit: Payer: Self-pay | Admitting: Family Medicine

## 2019-01-20 ENCOUNTER — Encounter: Payer: Self-pay | Admitting: Family Medicine

## 2019-01-20 NOTE — Telephone Encounter (Signed)
Patient has not had a regular follow up since 01/07/18.

## 2019-01-20 NOTE — Telephone Encounter (Signed)
Already done . Please refuse

## 2019-02-07 ENCOUNTER — Ambulatory Visit: Payer: Self-pay

## 2019-02-07 NOTE — Telephone Encounter (Signed)
There are no available slots for today, please advise.

## 2019-02-07 NOTE — Telephone Encounter (Signed)
Pt. Reports she noticed yesterday she has broken out with a poison ivy rash on her chin and finger. States she is allergic and would like a visit today. Please advise pt. Contact number - [phone].  Answer Assessment - Initial Assessment Questions 1. APPEARANCE of RASH: "Describe the rash."      Face - chin and finger 2. LOCATION: "Where is the rash located?"      Yellow - clear fluid filled, oozing 3. SIZE: "How large is the rash?"      Bump - eraser 4. ONSET: "When did the rash begin?"      Started yesterday 5. ITCHING: "Does the rash itch?" If so, ask: "How bad is it?"   - MILD - doesn't interfere with normal activities   - MODERATE-SEVERE: interferes with work, school, sleep, or other activities      Moderate 6. PREGNANCY: "Is there any chance you are pregnant?" "When was your last menstrual period?"     No  Protocols used: POISON IVY - OAK - SUMAC-A-AH

## 2019-02-07 NOTE — Telephone Encounter (Signed)
Spoke with pt and documented on another encounter.

## 2019-02-07 NOTE — Telephone Encounter (Signed)
Advise to go to UC or we can see her Monday

## 2019-02-18 ENCOUNTER — Other Ambulatory Visit: Payer: Self-pay | Admitting: Family Medicine

## 2019-02-18 NOTE — Telephone Encounter (Signed)
Requested medication (s) are due for refill today: yes  Requested medication (s) are on the active medication list:yes  Last refill:  01/20/19  Future visit scheduled:no  Notes to clinic: review for refill   Requested Prescriptions  Pending Prescriptions Disp Refills   venlafaxine (EFFEXOR) 37.5 MG tablet [Pharmacy Med Name: VENLAFAXINE 37.5MG  TABLETS] 60 tablet 0    Sig: TAKE 1 TABLET(37.5 MG) BY MOUTH TWICE DAILY     Psychiatry: Antidepressants - SNRI - desvenlafaxine & venlafaxine Failed - 02/18/2019  7:28 AM      Failed - LDL in normal range and within 360 days    LDL Calculated  Date Value Ref Range Status  01/07/2018 99 0 - 99 mg/dL Final         Failed - Total Cholesterol in normal range and within 360 days    Cholesterol, Total  Date Value Ref Range Status  01/07/2018 174 100 - 199 mg/dL Final         Failed - Triglycerides in normal range and within 360 days    Triglycerides  Date Value Ref Range Status  01/07/2018 66 0 - 149 mg/dL Final         Passed - Last BP in normal range    BP Readings from Last 1 Encounters:  01/07/18 107/72         Passed - Valid encounter within last 6 months    Recent Outpatient Visits          1 month ago Abnormal chest x-ray   [facility] [place], [place]   2 months ago [place], [place], [place]   1 year ago Migraine without status migrainosus, not intractable, unspecified migraine type   [facility], [place], [place]   1 year ago Migraine without status migrainosus, not intractable, unspecified migraine type   [facility], [place], [place]   1 year ago Mild episode of recurrent major depressive disorder [facility])   [place], [place], [place]             Passed - Completed PHQ-2 or PHQ-9 in the last 360 days.

## 2019-02-18 NOTE — Telephone Encounter (Signed)
Patient has not been seen for regular f/up visit in over a year.

## 2019-02-20 ENCOUNTER — Encounter: Payer: Self-pay | Admitting: Family Medicine

## 2019-02-28 ENCOUNTER — Other Ambulatory Visit: Payer: Self-pay

## 2019-02-28 MED ORDER — ELETRIPTAN HYDROBROMIDE 40 MG PO TABS: 40.0000 mg | ORAL_TABLET | ORAL | 1 refills | Status: DC | PRN

## 2019-03-03 ENCOUNTER — Telehealth: Payer: Self-pay

## 2019-03-03 NOTE — Telephone Encounter (Signed)
PA not required by insurance. Fill too early is reasoning why they won't cover.  PA sent back to pharmacy.

## 2019-03-03 NOTE — Telephone Encounter (Signed)
Prior Authorization initiated via CoverMyMeds for Eletriptan Hydrobromide 40MG  tablet Key: [id number]

## 2019-03-19 ENCOUNTER — Other Ambulatory Visit: Payer: Self-pay

## 2019-03-19 NOTE — Telephone Encounter (Signed)
Routing to provider  

## 2019-03-21 MED ORDER — VENLAFAXINE HCL 37.5 MG PO TABS: 37.5000 mg | ORAL_TABLET | Freq: Two times a day (BID) | ORAL | 0 refills | Status: DC

## 2019-03-27 ENCOUNTER — Encounter: Payer: Self-pay | Admitting: Family Medicine

## 2019-03-27 ENCOUNTER — Ambulatory Visit (INDEPENDENT_AMBULATORY_CARE_PROVIDER_SITE_OTHER): Payer: BC Managed Care – PPO | Admitting: Family Medicine

## 2019-03-27 ENCOUNTER — Other Ambulatory Visit: Payer: Self-pay

## 2019-03-27 VITALS — BP 109/72 | HR 79 | Temp 98.6°F

## 2019-03-27 DIAGNOSIS — G43909 Migraine, unspecified, not intractable, without status migrainosus: Secondary | ICD-10-CM

## 2019-03-27 DIAGNOSIS — Z23 Encounter for immunization: Secondary | ICD-10-CM

## 2019-03-27 DIAGNOSIS — F330 Major depressive disorder, recurrent, mild: Secondary | ICD-10-CM | POA: Diagnosis not present

## 2019-03-27 MED ORDER — VENLAFAXINE HCL 37.5 MG PO TABS: 37.5000 mg | ORAL_TABLET | Freq: Two times a day (BID) | ORAL | 1 refills | Status: DC

## 2019-03-27 MED ORDER — RIZATRIPTAN BENZOATE 10 MG PO TABS: ORAL_TABLET | ORAL | 6 refills | Status: DC

## 2019-03-27 MED ORDER — PROMETHAZINE HCL 25 MG PO TABS: 25.0000 mg | ORAL_TABLET | Freq: Three times a day (TID) | ORAL | 0 refills | Status: DC | PRN

## 2019-03-27 NOTE — Patient Instructions (Signed)

## 2019-03-27 NOTE — Progress Notes (Signed)
BP 109/72   Pulse 79   Temp 98.6 F (37 C) (Oral)   LMP 03/21/2019 (Exact Date)   SpO2 97%    Subjective:    Patient ID: Ruth Hammond, female    DOB: 08/20/1970, 48 y.o.   MRN: [id number]  HPI: Ruth Hammond is a 48 y.o. female  Chief Complaint  Patient presents with  . Depression   Patient presenting today for f/u depression and migraines.   Effexor continues to do very well for her moods. Taking faithfully without side effects. Denies severe mood swings, crying spells, anhedonia, SI/HI.   Taking maxalt fairly regularly at this point due to more frequent migraines. Having to use more than the 10/month allotted so having to also pick up her replax script to use some of those. States she's having to take something about 15 times per month for migraines. They're not severe per patient but she knows she needs to take one right away so takes one if she notices any sxs whatsoever. Not interested at this time in a preventative medication. Does occasionally have nausea with these migraines.   Depression screen [facility] 2/9 03/27/2019 01/07/2018 05/18/2016  Decreased Interest 0 0 0  Down, Depressed, Hopeless 0 0 0  PHQ - 2 Score 0 0 0  Altered sleeping 0 0 0  Tired, decreased energy 0 0 0  Change in appetite 0 0 0  Feeling bad or failure about yourself  0 0 0  Trouble concentrating 0 0 0  Moving slowly or fidgety/restless 0 0 0  Suicidal thoughts 0 0 0  PHQ-9 Score 0 0 0  Difficult doing work/chores Somewhat difficult - -    Relevant past medical, surgical, family and social history reviewed and updated as indicated. Interim medical history since our last visit reviewed. Allergies and medications reviewed and updated.  Review of Systems  Per HPI unless specifically indicated above     Objective:    BP 109/72   Pulse 79   Temp 98.6 F (37 C) (Oral)   LMP 03/21/2019 (Exact Date)   SpO2 97%   Wt Readings from Last 3 Encounters:  01/07/18 155 lb 4.8 oz (70.4 kg)  12/13/17  150 lb (68 kg)  08/06/17 150 lb 12.8 oz (68.4 kg)    Physical Exam Vitals signs and nursing note reviewed.  Constitutional:      Appearance: Normal appearance. She is not ill-appearing.  HENT:     Head: Atraumatic.  Eyes:     Extraocular Movements: Extraocular movements intact.     Conjunctiva/sclera: Conjunctivae normal.  Neck:     Musculoskeletal: Normal range of motion and neck supple.  Cardiovascular:     Rate and Rhythm: Normal rate and regular rhythm.     Heart sounds: Normal heart sounds.  Pulmonary:     Effort: Pulmonary effort is normal.     Breath sounds: Normal breath sounds.  Abdominal:     General: Bowel sounds are normal.     Palpations: Abdomen is soft.     Tenderness: There is no abdominal tenderness. There is no guarding.  Musculoskeletal: Normal range of motion.  Skin:    General: Skin is warm and dry.  Neurological:     General: No focal deficit present.     Mental Status: She is alert and oriented to person, place, and time.  Psychiatric:        Mood and Affect: Mood normal.        Thought Content: Thought  content normal.        Judgment: Judgment normal.     Results for orders placed or performed in visit on 02/05/18  HM MAMMOGRAPHY  Result Value Ref Range   HM Mammogram 0-4 Bi-Rad 0-4 Bi-Rad, Self Reported Normal      Assessment & Plan:   Problem List Items Addressed This Visit      Cardiovascular and Mediastinum   Migraine    Declines any preventative measures, will increase maxalt quantity and work on identifying/avoiding triggers. F/u in 6 months unless wanting to add new medication on sooner      Relevant Medications   venlafaxine (EFFEXOR) 37.5 MG tablet   rizatriptan (MAXALT) 10 MG tablet     Other   Depression    Chronic, stable and under good control. Continue current regimen      Relevant Medications   venlafaxine (EFFEXOR) 37.5 MG tablet    Other Visit Diagnoses    Need for influenza vaccination    -  Primary   Relevant  Orders   Flu Vaccine QUAD 36+ mos IM (Completed)    25 minutes spent today in direct care and counseling with patient.    Follow up plan: Return in about 6 months (around 09/25/2019) for CPE.

## 2019-03-31 NOTE — Assessment & Plan Note (Signed)
Chronic, stable and under good control. Continue current regimen °

## 2019-03-31 NOTE — Assessment & Plan Note (Signed)
Declines any preventative measures, will increase maxalt quantity and work on identifying/avoiding triggers. F/u in 6 months unless wanting to add new medication on sooner

## 2019-06-02 ENCOUNTER — Telehealth: Payer: Self-pay

## 2019-06-02 ENCOUNTER — Other Ambulatory Visit: Payer: Self-pay

## 2019-06-02 MED ORDER — RIZATRIPTAN BENZOATE 10 MG PO TABS: ORAL_TABLET | ORAL | 6 refills | Status: DC

## 2019-06-02 NOTE — Telephone Encounter (Signed)
PA Approved

## 2019-06-02 NOTE — Telephone Encounter (Signed)
Medication refill request for Rizatriptan 10mg . LOV: 03/27/2019

## 2019-06-02 NOTE — Telephone Encounter (Signed)
Prior Authorization initiated via CoverMyMeds for Eletriptan Hydrobromide 40MG  tablets Key: [id number]

## 2019-07-08 LAB — HM PAP SMEAR

## 2019-07-25 ENCOUNTER — Encounter: Payer: Self-pay | Admitting: Family Medicine

## 2019-08-03 ENCOUNTER — Encounter: Payer: Self-pay | Admitting: Family Medicine

## 2019-08-04 ENCOUNTER — Other Ambulatory Visit: Payer: Self-pay | Admitting: Family Medicine

## 2019-08-04 MED ORDER — NITROFURANTOIN MONOHYD MACRO 100 MG PO CAPS: 100.0000 mg | ORAL_CAPSULE | Freq: Two times a day (BID) | ORAL | 1 refills | Status: DC

## 2019-08-16 ENCOUNTER — Other Ambulatory Visit: Payer: Self-pay | Admitting: Family Medicine

## 2019-08-16 NOTE — Telephone Encounter (Signed)
Requested medication (s) are due for refill today: yes  Requested medication (s) are on the active medication list: yes  Last refill:  03/27/19  Future visit scheduled: yes  Notes to clinic:  medication not delegated to NT to refill   Requested Prescriptions  Pending Prescriptions Disp Refills   promethazine (PHENERGAN) 25 MG tablet [Pharmacy Med Name: PROMETHAZINE 25MG  TABLETS] 40 tablet 0    Sig: TAKE 1 TABLET(25 MG) BY MOUTH EVERY 8 HOURS AS NEEDED FOR NAUSEA OR VOMITING      Not Delegated - Gastroenterology: Antiemetics Failed - 08/16/2019  4:04 PM      Failed - This refill cannot be delegated      Passed - Valid encounter within last 6 months    Recent Outpatient Visits           4 months ago Need for influenza vaccination   [facility], [place]   7 months ago Abnormal chest x-ray   [facility], [place]   8 months ago Dysuria   [facility] [place], [place], [place]   1 year ago Migraine without status migrainosus, not intractable, unspecified migraine type   [facility], [place], [place]   2 years ago Migraine without status migrainosus, not intractable, unspecified migraine type   [facility], [facility], [place]       Future Appointments             In 1 month [place], Maurice March, PA-C [facility], [facility]

## 2019-08-17 ENCOUNTER — Ambulatory Visit: Payer: BC Managed Care – PPO | Attending: Internal Medicine

## 2019-08-17 DIAGNOSIS — Z23 Encounter for immunization: Secondary | ICD-10-CM

## 2019-08-17 NOTE — Progress Notes (Signed)
   Covid-19 Vaccination Clinic  Name:  DEVINA BEZOLD    MRN: [id number] DOB: [birth year]  08/17/2019  Ms. Caughlin was observed post Covid-19 immunization for 15 minutes without incidence. She was provided with Vaccine Information Sheet and instruction to access the V-Safe system.   Ms. Brugger was instructed to call 911 with any severe reactions post vaccine: Marland Kitchen Difficulty breathing  . Swelling of your face and throat  . A fast heartbeat  . A bad rash all over your body  . Dizziness and weakness    Immunizations Administered    Name Date Dose VIS Date Route   Pfizer COVID-19 Vaccine 08/17/2019 11:59 AM 0.3 mL 05/30/2019 Intramuscular   Manufacturer: [employer], [employer]   Lot: [id number]   NDC: [address]-1

## 2019-08-18 NOTE — Telephone Encounter (Signed)
Routing to provider  

## 2019-09-10 ENCOUNTER — Ambulatory Visit: Payer: BC Managed Care – PPO | Attending: Internal Medicine

## 2019-09-10 DIAGNOSIS — Z23 Encounter for immunization: Secondary | ICD-10-CM

## 2019-09-10 NOTE — Progress Notes (Signed)
   Covid-19 Vaccination Clinic  Name:  ALLICE GARRO    MRN: [id number] DOB: 1971/03/05  09/10/2019  Ms. Olivier was observed post Covid-19 immunization for 15 minutes without incident. She was provided with Vaccine Information Sheet and instruction to access the V-Safe system.   Ms. Gullickson was instructed to call 911 with any severe reactions post vaccine: Marland Kitchen Difficulty breathing  . Swelling of face and throat  . A fast heartbeat  . A bad rash all over body  . Dizziness and weakness   Immunizations Administered    Name Date Dose VIS Date Route   Pfizer COVID-19 Vaccine 09/10/2019  3:47 PM 0.3 mL 05/30/2019 Intramuscular   Manufacturer: [employer], [employer]   Lot: [id number]   NDC: [address]-0

## 2019-09-25 ENCOUNTER — Ambulatory Visit: Payer: BC Managed Care – PPO | Admitting: Family Medicine

## 2019-10-09 ENCOUNTER — Ambulatory Visit: Payer: BC Managed Care – PPO | Admitting: Family Medicine

## 2019-10-18 ENCOUNTER — Other Ambulatory Visit: Payer: Self-pay | Admitting: Family Medicine

## 2019-10-18 NOTE — Telephone Encounter (Signed)
Requested Prescriptions  Pending Prescriptions Disp Refills  . venlafaxine (EFFEXOR) 37.5 MG tablet [Pharmacy Med Name: VENLAFAXINE 37.5MG  TABLETS] 180 tablet 0    Sig: TAKE 1 TABLET(37.5 MG) BY MOUTH TWICE DAILY WITH A MEAL     Psychiatry: Antidepressants - SNRI - desvenlafaxine & venlafaxine Failed - 10/18/2019  8:08 AM      Failed - LDL in normal range and within 360 days    LDL Calculated  Date Value Ref Range Status  01/07/2018 99 0 - 99 mg/dL Final         Failed - Total Cholesterol in normal range and within 360 days    Cholesterol, Total  Date Value Ref Range Status  01/07/2018 174 100 - 199 mg/dL Final         Failed - Triglycerides in normal range and within 360 days    Triglycerides  Date Value Ref Range Status  01/07/2018 66 0 - 149 mg/dL Final         Failed - Valid encounter within last 6 months    Recent Outpatient Visits          6 months ago Need for influenza vaccination   [facility] [place], [place]   9 months ago Abnormal chest x-ray   [facility] [place], [place]   10 months ago Dysuria   [facility] [place], [place], [place]   1 year ago Migraine without status migrainosus, not intractable, unspecified migraine type   [facility], [place], [place]   2 years ago Migraine without status migrainosus, not intractable, unspecified migraine type   [facility], [place], [place]      Future Appointments            In 1 month Maurice March, [place], PA-C [facility], [facility]           Passed - Completed PHQ-2 or PHQ-9 in the last 360 days.      Passed - Last BP in normal range    BP Readings from Last 1 Encounters:  03/27/19 109/72

## 2019-11-27 ENCOUNTER — Encounter: Payer: Self-pay | Admitting: Family Medicine

## 2019-11-27 ENCOUNTER — Ambulatory Visit (INDEPENDENT_AMBULATORY_CARE_PROVIDER_SITE_OTHER): Payer: BC Managed Care – PPO | Admitting: Family Medicine

## 2019-11-27 ENCOUNTER — Other Ambulatory Visit: Payer: Self-pay

## 2019-11-27 VITALS — BP 112/72 | HR 79 | Temp 98.5°F | Ht 65.5 in | Wt 154.0 lb

## 2019-11-27 DIAGNOSIS — F330 Major depressive disorder, recurrent, mild: Secondary | ICD-10-CM | POA: Diagnosis not present

## 2019-11-27 DIAGNOSIS — F419 Anxiety disorder, unspecified: Secondary | ICD-10-CM | POA: Diagnosis not present

## 2019-11-27 DIAGNOSIS — Z1159 Encounter for screening for other viral diseases: Secondary | ICD-10-CM | POA: Diagnosis not present

## 2019-11-27 DIAGNOSIS — Z136 Encounter for screening for cardiovascular disorders: Secondary | ICD-10-CM

## 2019-11-27 DIAGNOSIS — R5383 Other fatigue: Secondary | ICD-10-CM

## 2019-11-27 DIAGNOSIS — Z0000 Encounter for general adult medical examination without abnormal findings: Secondary | ICD-10-CM | POA: Diagnosis not present

## 2019-11-27 DIAGNOSIS — Z114 Encounter for screening for human immunodeficiency virus [HIV]: Secondary | ICD-10-CM | POA: Diagnosis not present

## 2019-11-27 DIAGNOSIS — G43909 Migraine, unspecified, not intractable, without status migrainosus: Secondary | ICD-10-CM

## 2019-11-27 LAB — UA/M W/RFLX CULTURE, ROUTINE
Bilirubin, UA: NEGATIVE
Glucose, UA: NEGATIVE
Ketones, UA: NEGATIVE
Leukocytes,UA: NEGATIVE
Nitrite, UA: NEGATIVE
Protein,UA: NEGATIVE
RBC, UA: NEGATIVE
Specific Gravity, UA: 1.02 (ref 1.005–1.030)
Urobilinogen, Ur: 0.2 mg/dL (ref 0.2–1.0)
pH, UA: 6.5 (ref 5.0–7.5)

## 2019-11-27 MED ORDER — VENLAFAXINE HCL 37.5 MG PO TABS: ORAL_TABLET | ORAL | 3 refills | Status: DC

## 2019-11-27 MED ORDER — RIZATRIPTAN BENZOATE 10 MG PO TABS: ORAL_TABLET | ORAL | 11 refills | Status: DC

## 2019-11-27 MED ORDER — ELETRIPTAN HYDROBROMIDE 40 MG PO TABS: 40.0000 mg | ORAL_TABLET | ORAL | 5 refills | Status: DC | PRN

## 2019-11-27 NOTE — Progress Notes (Signed)
BP 112/72   Pulse 79   Temp 98.5 F (36.9 C) (Oral)   Ht 5' 5.5" (1.664 m)   Wt 154 lb (69.9 kg)   SpO2 96%   BMI 25.24 kg/m    Subjective:    Patient ID: Ruth Hammond, female    DOB: 1971-06-02, 49 y.o.   MRN: [id number]  HPI: Ruth Hammond is a 49 y.o. female presenting on 11/27/2019 for comprehensive medical examination. Current medical complaints include:see below  Having 3 migraines per month on average, which she can tend to cut off with her prn triptans and OTC pain relievers. Satisfied with current therapy. Denies N/V, visual changes, confusion, frequent missed work or activities due to these.   Effexor doing well for moods and anxiety, wanting to stay on current dose. Denies side effects, mood swings, SI/HI.   She currently lives with: Menopausal Symptoms: no  Depression Screen done today and results listed below:  Depression screen [facility] 2/9 11/27/2019 03/27/2019 01/07/2018 05/18/2016  Decreased Interest 0 0 0 0  Down, Depressed, Hopeless 0 0 0 0  PHQ - 2 Score 0 0 0 0  Altered sleeping 0 0 0 0  Tired, decreased energy 0 0 0 0  Change in appetite 0 0 0 0  Feeling bad or failure about yourself  0 0 0 0  Trouble concentrating 0 0 0 0  Moving slowly or fidgety/restless 0 0 0 0  Suicidal thoughts 0 0 0 0  PHQ-9 Score 0 0 0 0  Difficult doing work/chores - Somewhat difficult - -    The patient does not have a history of falls. I did complete a risk assessment for falls. A plan of care for falls was documented.   Past Medical History:  Past Medical History:  Diagnosis Date  . Anxiety   . Depression     Surgical History:  Past Surgical History:  Procedure Laterality Date  . DILATION AND CURETTAGE OF UTERUS    . neck fusion  10/2015  . TUBAL LIGATION    . WISDOM TOOTH EXTRACTION      Medications:  Current Outpatient Medications on File Prior to Visit  Medication Sig  . Cyanocobalamin (VITAMIN B 12 PO) Take by mouth daily.  Marland Kitchen ibuprofen (ADVIL,MOTRIN)  100 MG/5ML suspension Take 200 mg by mouth daily.  . Multiple Vitamin (MULTIVITAMIN) tablet Take 1 tablet by mouth daily.  . nitrofurantoin, macrocrystal-monohydrate, (MACROBID) 100 MG capsule Take 1 capsule (100 mg total) by mouth 2 (two) times daily.  . promethazine (PHENERGAN) 25 MG tablet TAKE 1 TABLET(25 MG) BY MOUTH EVERY 8 HOURS AS NEEDED FOR NAUSEA OR VOMITING  . Sulfacetamide Sodium, Acne, 10 % LOTN APP AA ON FACE AND LIPS BID UNTIL CLEAR   No current facility-administered medications on file prior to visit.    Allergies:  Allergies  Allergen Reactions  . Other Swelling    Bee stings    Social History:  Social History   Socioeconomic History  . Marital status: Married    Spouse name: Not on file  . Number of children: Not on file  . Years of education: Not on file  . Highest education level: Not on file  Occupational History  . Not on file  Tobacco Use  . Smoking status: Never Smoker  . Smokeless tobacco: Never Used  Vaping Use  . Vaping Use: Never used  Substance and Sexual Activity  . Alcohol use: No  . Drug use: No  . Sexual activity:  Not on file  Other Topics Concern  . Not on file  Social History Narrative  . Not on file   Social Determinants of Health   Financial Resource Strain:   . Difficulty of Paying Living Expenses:   Food Insecurity:   . Worried About Programme researcher, broadcasting/film/video in the Last Year:   . Barista in the Last Year:   Transportation Needs:   . Freight forwarder (Medical):   Marland Kitchen Lack of Transportation (Non-Medical):   Physical Activity:   . Days of Exercise per Week:   . Minutes of Exercise per Session:   Stress:   . Feeling of Stress :   Social Connections:   . Frequency of Communication with Friends and Family:   . Frequency of Social Gatherings with Friends and Family:   . Attends Religious Services:   . Active Member of Clubs or Organizations:   . Attends Banker Meetings:   Marland Kitchen Marital Status:   Intimate  Partner Violence:   . Fear of Current or Ex-Partner:   . Emotionally Abused:   Marland Kitchen Physically Abused:   . Sexually Abused:    Social History   Tobacco Use  Smoking Status Never Smoker  Smokeless Tobacco Never Used   Social History   Substance and Sexual Activity  Alcohol Use No    Family History:  Family History  Problem Relation Age of Onset  . Hyperlipidemia Mother   . Hypertension Mother   . Stroke Mother   . Diabetes Mother   . Heart disease Mother   . Cancer Father        bone  . Hyperlipidemia Sister   . Hypertension Sister   . Stroke Sister   . Cancer Maternal Grandmother        ovarian or cervical not sure  . Breast cancer Neg Hx     Past medical history, surgical history, medications, allergies, family history and social history reviewed with patient today and changes made to appropriate areas of the chart.   Review of Systems - General ROS: negative Psychological ROS: negative Ophthalmic ROS: negative ENT ROS: negative Allergy and Immunology ROS: negative Hematological and Lymphatic ROS: negative Endocrine ROS: negative Breast ROS: negative for breast lumps Respiratory ROS: no cough, shortness of breath, or wheezing Cardiovascular ROS: no chest pain or dyspnea on exertion Gastrointestinal ROS: no abdominal pain, change in bowel habits, or black or bloody stools Genito-Urinary ROS: no dysuria, trouble voiding, or hematuria Musculoskeletal ROS: negative Neurological ROS: no TIA or stroke symptoms Dermatological ROS: negative All other ROS negative except what is listed above and in the HPI.      Objective:    BP 112/72   Pulse 79   Temp 98.5 F (36.9 C) (Oral)   Ht 5' 5.5" (1.664 m)   Wt 154 lb (69.9 kg)   SpO2 96%   BMI 25.24 kg/m   Wt Readings from Last 3 Encounters:  11/27/19 154 lb (69.9 kg)  01/07/18 155 lb 4.8 oz (70.4 kg)  12/13/17 150 lb (68 kg)    Physical Exam Vitals and nursing note reviewed.  Constitutional:      General:  She is not in acute distress.    Appearance: She is well-developed.  HENT:     Head: Atraumatic.     Right Ear: External ear normal.     Left Ear: External ear normal.     Nose: Nose normal.     Mouth/Throat:  Pharynx: No oropharyngeal exudate.  Eyes:     General: No scleral icterus.    Conjunctiva/sclera: Conjunctivae normal.     Pupils: Pupils are equal, round, and reactive to light.  Neck:     Thyroid: No thyromegaly.  Cardiovascular:     Rate and Rhythm: Normal rate and regular rhythm.     Heart sounds: Normal heart sounds.  Pulmonary:     Effort: Pulmonary effort is normal. No respiratory distress.     Breath sounds: Normal breath sounds.  Chest:     Comments: Breast exam done through GYN Abdominal:     General: Bowel sounds are normal.     Palpations: Abdomen is soft. There is no mass.     Tenderness: There is no abdominal tenderness.  Genitourinary:    Comments: GU exam done through GYN Musculoskeletal:        General: No tenderness. Normal range of motion.     Cervical back: Normal range of motion and neck supple.  Lymphadenopathy:     Cervical: No cervical adenopathy.  Skin:    General: Skin is warm and dry.     Findings: No rash.  Neurological:     Mental Status: She is alert and oriented to person, place, and time.     Cranial Nerves: No cranial nerve deficit.  Psychiatric:        Behavior: Behavior normal.     Results for orders placed or performed in visit on 11/27/19  HIV Antibody (routine testing w rflx)  Result Value Ref Range   HIV Screen 4th Generation wRfx Non Reactive Non Reactive  CBC with Differential/Platelet  Result Value Ref Range   WBC 5.4 3.4 - 10.8 x10E3/uL   RBC 4.25 3.77 - 5.28 x10E6/uL   Hemoglobin 12.9 11.1 - 15.9 g/dL   Hematocrit 38.2 34.0 - 46.6 %   MCV 90 79 - 97 fL   MCH 30.4 26.6 - 33.0 pg   MCHC 33.8 31 - 35 g/dL   RDW 12.9 11.7 - 15.4 %   Platelets 260 150 - 450 x10E3/uL   Neutrophils 65 Not Estab. %   Lymphs 22  Not Estab. %   Monocytes 9 Not Estab. %   Eos 3 Not Estab. %   Basos 1 Not Estab. %   Neutrophils Absolute 3.5 1 - 7 x10E3/uL   Lymphocytes Absolute 1.2 0 - 3 x10E3/uL   Monocytes Absolute 0.5 0 - 0 x10E3/uL   EOS (ABSOLUTE) 0.2 0.0 - 0.4 x10E3/uL   Basophils Absolute 0.1 0 - 0 x10E3/uL   Immature Granulocytes 0 Not Estab. %   Immature Grans (Abs) 0.0 0.0 - 0.1 x10E3/uL  Comprehensive metabolic panel  Result Value Ref Range   Glucose 85 65 - 99 mg/dL   BUN 31 (H) 6 - 24 mg/dL   Creatinine, Ser 0.75 0.57 - 1.00 mg/dL   GFR calc non Af Amer 93 >59 mL/min/1.73   GFR calc Af Amer 107 >59 mL/min/1.73   BUN/Creatinine Ratio 41 (H) 9 - 23   Sodium 139 134 - 144 mmol/L   Potassium 4.6 3.5 - 5.2 mmol/L   Chloride 104 96 - 106 mmol/L   CO2 22 20 - 29 mmol/L   Calcium 9.3 8.7 - 10.2 mg/dL   Total Protein 6.6 6.0 - 8.5 g/dL   Albumin 4.3 3.8 - 4.8 g/dL   Globulin, Total 2.3 1.5 - 4.5 g/dL   Albumin/Globulin Ratio 1.9 1.2 - 2.2   Bilirubin Total <0.2 0.0 -  1.2 mg/dL   Alkaline Phosphatase 43 (L) 48 - 121 IU/L   AST 20 0 - 40 IU/L   ALT 20 0 - 32 IU/L  Lipid Panel w/o Chol/HDL Ratio  Result Value Ref Range   Cholesterol, Total 180 100 - 199 mg/dL   Triglycerides 95 0 - 149 mg/dL   HDL 63 >09 mg/dL   VLDL Cholesterol Cal 17 5 - 40 mg/dL   LDL Chol Calc (NIH) 983 (H) 0 - 99 mg/dL  VITAMIN D 25 Hydroxy (Vit-D Deficiency, Fractures)  Result Value Ref Range   Vit D, 25-Hydroxy 51.5 30.0 - 100.0 ng/mL  TSH  Result Value Ref Range   TSH 1.920 0.450 - 4.500 uIU/mL  UA/M w/rflx Culture, Routine   Specimen: Blood   BLD  Result Value Ref Range   Specific Gravity, UA 1.020 1.005 - 1.030   pH, UA 6.5 5.0 - 7.5   Color, UA Yellow Yellow   Appearance Ur Clear Clear   Leukocytes,UA Negative Negative   Protein,UA Negative Negative/Trace   Glucose, UA Negative Negative   Ketones, UA Negative Negative   RBC, UA Negative Negative   Bilirubin, UA Negative Negative   Urobilinogen, Ur 0.2 0.2 -  1.0 mg/dL   Nitrite, UA Negative Negative  Vitamin B12  Result Value Ref Range   Vitamin B-12 1,111 232 - 1,245 pg/mL  Hepatitis C antibody  Result Value Ref Range   Hep C Virus Ab <0.1 0.0 - 0.9 s/co ratio      Assessment & Plan:   Problem List Items Addressed This Visit      Cardiovascular and Mediastinum   Migraine - Primary    Stable and well controlled, continue current regimen      Relevant Medications   venlafaxine (EFFEXOR) 37.5 MG tablet   rizatriptan (MAXALT) 10 MG tablet   eletriptan (RELPAX) 40 MG tablet     Other   Anxiety    Chronic, stable and well controlled, continue current regimen      Relevant Medications   venlafaxine (EFFEXOR) 37.5 MG tablet   Depression    Stable, well controlled. Continue current regimen      Relevant Medications   venlafaxine (EFFEXOR) 37.5 MG tablet    Other Visit Diagnoses    Annual physical exam       Relevant Orders   CBC with Differential/Platelet (Completed)   Comprehensive metabolic panel (Completed)   UA/M w/rflx Culture, Routine (Completed)   Fatigue, unspecified type       Check labs, supplement vitamins   Relevant Orders   VITAMIN D 25 Hydroxy (Vit-D Deficiency, Fractures) (Completed)   TSH (Completed)   Vitamin B12 (Completed)   Encounter for screening for HIV       Relevant Orders   HIV Antibody (routine testing w rflx) (Completed)   Need for hepatitis C screening test       Relevant Orders   Hepatitis C antibody   Screening for cardiovascular condition       Relevant Orders   Lipid Panel w/o Chol/HDL Ratio (Completed)       Follow up plan: Return in about 1 year (around 11/26/2020) for CPE.   LABORATORY TESTING:  - Pap smear: up to date - per patient this was done last year through GYN - awaiting records  IMMUNIZATIONS:   - Tdap: Tetanus vaccination status reviewed: last tetanus booster within 10 years. - Influenza: Postponed to flu season  SCREENING: -Mammogram: done through GYN last year  per  patient  - Colonoscopy: long discussion about colonoscopy vs cologuard - pt wishes to think about it and let us know   PATIENT COUNSELING:   Advised to take 1 mg of folate supplement per day if capable of pregnancy.   Sexuality: Discussed sexually transmitted diseases, partner selection, use of condoms, avoidance of unintended pregnancy  and contraceptive alternatives.   Advised to avoid cigarette smoking.  I discussed with the patient that most people either abstain from alcohol or drink within safe limits (<=14/week and <=4 drinks/occasion for males, <=7/weeks and <= 3 drinks/occasion for females) and that the risk for alcohol disorders and other health effects rises proportionally with the number of drinks per week and how often a drinker exceeds daily limits.  Discussed cessation/primary prevention of drug use and availability of treatment for abuse.   Diet: Encouraged to adjust caloric intake to maintain  or achieve ideal body weight, to reduce intake of dietary saturated fat and total fat, to limit sodium intake by avoiding high sodium foods and not adding table salt, and to maintain adequate dietary potassium and calcium preferably from fresh fruits, vegetables, and low-fat dairy products.    stressed the importance of regular exercise  Injury prevention: Discussed safety belts, safety helmets, smoke detector, smoking near bedding or upholstery.   Dental health: Discussed importance of regular tooth brushing, flossing, and dental visits.    NEXT PREVENTATIVE PHYSICAL DUE IN 1 YEAR. Return in about 1 year (around 11/26/2020) for CPE.

## 2019-11-28 ENCOUNTER — Telehealth: Payer: Self-pay

## 2019-11-28 LAB — COMPREHENSIVE METABOLIC PANEL
ALT: 20 IU/L (ref 0–32)
AST: 20 IU/L (ref 0–40)
Albumin/Globulin Ratio: 1.9 (ref 1.2–2.2)
Albumin: 4.3 g/dL (ref 3.8–4.8)
Alkaline Phosphatase: 43 IU/L — ABNORMAL LOW (ref 48–121)
BUN/Creatinine Ratio: 41 — ABNORMAL HIGH (ref 9–23)
BUN: 31 mg/dL — ABNORMAL HIGH (ref 6–24)
Bilirubin Total: <0.2 mg/dL (ref 0.0–1.2)
CO2: 22 mmol/L (ref 20–29)
Calcium: 9.3 mg/dL (ref 8.7–10.2)
Chloride: 104 mmol/L (ref 96–106)
Creatinine, Ser: 0.75 mg/dL (ref 0.57–1.00)
GFR calc Af Amer: 107 mL/min/{1.73_m2} (ref >59)
GFR calc non Af Amer: 93 mL/min/{1.73_m2} (ref >59)
Globulin, Total: 2.3 g/dL (ref 1.5–4.5)
Glucose: 85 mg/dL (ref 65–99)
Potassium: 4.6 mmol/L (ref 3.5–5.2)
Sodium: 139 mmol/L (ref 134–144)
Total Protein: 6.6 g/dL (ref 6.0–8.5)

## 2019-11-28 LAB — CBC WITH DIFFERENTIAL/PLATELET
Basophils Absolute: 0.1 10*3/uL (ref 0.0–0.2)
Basos: 1 %
EOS (ABSOLUTE): 0.2 10*3/uL (ref 0.0–0.4)
Eos: 3 %
Hematocrit: 38.2 % (ref 34.0–46.6)
Hemoglobin: 12.9 g/dL (ref 11.1–15.9)
Immature Grans (Abs): 0 10*3/uL (ref 0.0–0.1)
Immature Granulocytes: 0 %
Lymphocytes Absolute: 1.2 10*3/uL (ref 0.7–3.1)
Lymphs: 22 %
MCH: 30.4 pg (ref 26.6–33.0)
MCHC: 33.8 g/dL (ref 31.5–35.7)
MCV: 90 fL (ref 79–97)
Monocytes Absolute: 0.5 10*3/uL (ref 0.1–0.9)
Monocytes: 9 %
Neutrophils Absolute: 3.5 10*3/uL (ref 1.4–7.0)
Neutrophils: 65 %
Platelets: 260 10*3/uL (ref 150–450)
RBC: 4.25 x10E6/uL (ref 3.77–5.28)
RDW: 12.9 % (ref 11.7–15.4)
WBC: 5.4 10*3/uL (ref 3.4–10.8)

## 2019-11-28 LAB — TSH: TSH: 1.92 u[IU]/mL (ref 0.450–4.500)

## 2019-11-28 LAB — HEPATITIS C ANTIBODY: Hep C Virus Ab: <0.1 s/co ratio (ref 0.0–0.9)

## 2019-11-28 LAB — LIPID PANEL W/O CHOL/HDL RATIO
Cholesterol, Total: 180 mg/dL (ref 100–199)
HDL: 63 mg/dL (ref >39)
LDL Chol Calc (NIH): 100 mg/dL — ABNORMAL HIGH (ref 0–99)
Triglycerides: 95 mg/dL (ref 0–149)
VLDL Cholesterol Cal: 17 mg/dL (ref 5–40)

## 2019-11-28 LAB — HIV ANTIBODY (ROUTINE TESTING W REFLEX): HIV Screen 4th Generation wRfx: NONREACTIVE

## 2019-11-28 LAB — VITAMIN D 25 HYDROXY (VIT D DEFICIENCY, FRACTURES): Vit D, 25-Hydroxy: 51.5 ng/mL (ref 30.0–100.0)

## 2019-11-28 LAB — VITAMIN B12: Vitamin B-12: 1111 pg/mL (ref 232–1245)

## 2019-11-28 NOTE — Telephone Encounter (Signed)
Called patient to ask her to come by to the office to sign the medical records release form at her convenience time. LVM for patient to return my call with any questions.

## 2019-12-02 NOTE — Assessment & Plan Note (Signed)
Stable and well controlled, continue current regimen 

## 2019-12-02 NOTE — Assessment & Plan Note (Signed)
Stable, well controlled. Continue current regimen 

## 2019-12-02 NOTE — Assessment & Plan Note (Signed)
Chronic, stable and well controlled, continue current regimen 

## 2020-01-14 ENCOUNTER — Other Ambulatory Visit: Payer: Self-pay | Admitting: Family Medicine

## 2020-01-24 ENCOUNTER — Encounter: Payer: Self-pay | Admitting: Family Medicine

## 2020-01-26 ENCOUNTER — Other Ambulatory Visit: Payer: Self-pay | Admitting: Nurse Practitioner

## 2020-01-26 MED ORDER — PROMETHAZINE HCL 25 MG PO TABS: ORAL_TABLET | ORAL | 0 refills | Status: DC

## 2020-05-22 ENCOUNTER — Telehealth: Payer: Self-pay | Admitting: Family Medicine

## 2020-05-22 NOTE — Telephone Encounter (Signed)
  Notes to clinic please assess.  

## 2020-05-27 NOTE — Telephone Encounter (Signed)
Pt has called back again wants at least a couple pills  until she can get appt, going into total anxiety as she starts vomiting if a headache starts.

## 2020-05-27 NOTE — Telephone Encounter (Signed)
Routing to provider  

## 2020-05-27 NOTE — Telephone Encounter (Signed)
Rx request was sent to [facility] 5 days ago

## 2020-05-27 NOTE — Telephone Encounter (Signed)
eletriptan (RELPAX) 40 MG tablet Medication Date: 11/27/2019 Department: [facility] Ordering/Authorizing: Particia Nearing, PA-C    Order Providers  Pt has been checking on this daily with Pharmacy since 4th and has gotten no FU. Pt is out of med and is having anxiety about getting a migraine and not having med. Please advise, willing to make appt but not sure would like to be put with provider assigned to. FU asap to let pt know what she needs to do [phone] possible enough [facility] to see her to appt?

## 2020-05-27 NOTE — Telephone Encounter (Signed)
Called pt apologized for the delay and advised rx has been sent. Pt verbalized understanding

## 2020-05-27 NOTE — Telephone Encounter (Signed)
Sent in refills 

## 2020-06-08 ENCOUNTER — Other Ambulatory Visit: Payer: Self-pay

## 2020-06-08 ENCOUNTER — Encounter: Payer: Self-pay | Admitting: Family Medicine

## 2020-06-08 ENCOUNTER — Ambulatory Visit (INDEPENDENT_AMBULATORY_CARE_PROVIDER_SITE_OTHER): Payer: BC Managed Care – PPO | Admitting: Family Medicine

## 2020-06-08 DIAGNOSIS — H1033 Unspecified acute conjunctivitis, bilateral: Secondary | ICD-10-CM | POA: Diagnosis not present

## 2020-06-08 DIAGNOSIS — H109 Unspecified conjunctivitis: Secondary | ICD-10-CM | POA: Insufficient documentation

## 2020-06-08 MED ORDER — ERYTHROMYCIN 5 MG/GM OP OINT
1.0000 | TOPICAL_OINTMENT | Freq: Two times a day (BID) | OPHTHALMIC | 0 refills | Status: DC
Start: 2020-06-08 — End: 2020-12-22

## 2020-06-08 NOTE — Assessment & Plan Note (Signed)
Viral vs bacterial. Given h/o discharge with slight discharge seen on exam today, will treat with erythromycin x5 days. RTC if no better.

## 2020-06-08 NOTE — Progress Notes (Signed)
° ° °  SUBJECTIVE:   CHIEF COMPLAINT / HPI:   Patient Active Problem List   Diagnosis Date Noted   Conjunctivitis 06/08/2020   Migraine 08/06/2017   Chronic left-sided headaches 06/15/2015   Left cervical radiculopathy 06/07/2015   GERD (gastroesophageal reflux disease) 01/08/2015   Anxiety    Depression    EYE PAIN uration:  2 days Involved eye:  bilateral Onset: sudden Foreign body sensation:yes Visual impairment: no Eye redness: yes Discharge: yes, mucusy Crusting or matting of eyelids: yes Swelling: no Photophobia: no Itching: yes Tearing: yes Headache: no Floaters: yes URI symptoms: no Contact lens use: yes Close contacts with similar problems: no Eye trauma: no Aggravating factors: none Alleviating factors: none Status: stable Treatments attempted: none 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   OBJECTIVE:   BP 125/85    Pulse 81    Temp 98.2 F (36.8 C)    Wt 158 lb 8 oz (71.9 kg)    SpO2 99%    BMI 25.97 kg/m   Gen: well appearing, in NAD HEENT: injected conjunctiva bilaterally with corneal sparing. With slight discharge in nasolacrimal corner in L eye. Oropharynx clear.  ASSESSMENT/PLAN:   Conjunctivitis Viral vs bacterial. Given h/o discharge with slight discharge seen on exam today, will treat with erythromycin x5 days. RTC if no better.    Caro Laroche, DO

## 2020-06-08 NOTE — Patient Instructions (Signed)
Adenovirus Infection, Adult Adenoviruses are common viruses that cause many types of infections. These viruses may affect the nose, throat, windpipe, and lungs (respiratory system), as well as other parts of the body, including the eyes, stomach, bowels, bladder, and brain. The most common type of adenovirus infection is the common cold. Usually, adenovirus infections are not severe. However, they can become severe in people who have another health problem that makes it hard to fight off infection. What are the causes? This condition is caused by an adenovirus entering your body. Some ways this can happen are:  Touching a surface or object that has an adenovirus on it and then touching your mouth, nose, or eyes with unwashed hands.  Coming in close physical contact with someone who has this type of infection. This may happen if you hug or shake hands with the person.  Breathing in droplets that fly through the air when someone who has the infection talks, coughs, or sneezes.  Having contact with stool (feces) that has the virus in it.  Using a swimming pool that does not have enough chlorine in it. Chlorine is a chemical that kills germs. Adenoviruses can live outside the body for a long time. They spread easily from person to person (are contagious). What increases the risk? The following factors may make you more likely to develop this condition:  Spending a lot of time in places where there are many people. These include schools, summer camps, day care centers, community centers, and training centers for people who join the military.  Being an older adult.  Having a weak immune system. This is the body's defense system.  Having a disease of the respiratory system.  Having a heart condition. What are the signs or symptoms? Adenovirus infections usually cause flu-like symptoms. When the virus enters the body, symptoms of this condition can take up to 14 days to develop. Symptoms may  include:  Having lung and breathing problems, such as: ? Cough. ? Trouble breathing. ? Runny nose or stuffy (congested) nose.  Feeling aches and pains, including: ? Headache. ? Stiff neck. ? Sore throat. ? Ear pain or congested ears. ? Stomachache.  Having digestive problems, such as: ? Feeling nauseous or vomiting. ? Having diarrhea.  Having a fever.  Having eye problems, such as pink eye (conjunctivitis), causing inflammation and redness.  Rash.  Less common symptoms include: ? Being confused or not knowing the time of day or where you are (disoriented). ? Having blood in your urine or having pain while urinating. How is this diagnosed? This condition may be diagnosed based on your symptoms and a physical exam. Your health care provider may order tests to make sure your symptoms are not caused by another problem. Tests can include:  Blood tests.  Urine tests.  Stool tests.  Chest X-ray.  Tests of tissue or mucus from your throat. How is this treated? This condition goes away on its own with time. Treatment for this condition involves managing symptoms until they go away. Your health care provider may recommend:  Getting plenty of rest.  Drinking more fluids than usual.  Taking over-the-counter medicine to help relieve a sore throat, fever, or headache. Follow these instructions at home:  Lifestyle  Do not drink alcohol.  Do not use any products that contain nicotine or tobacco, such as cigarettes, e-cigarettes, and chewing tobacco. If you need help quitting, ask your health care provider. General instructions  Take over-the-counter and prescription medicines only as told by   your health care provider.  Rest at home until your symptoms go away.  Drink enough fluid to keep your urine pale yellow.  If you have a sore throat, gargle with a salt-water mixture 3-4 times a day or as needed. To make a salt-water mixture, completely dissolve -1 tsp (3-6 g) of  salt in 1 cup (237 mL) of warm water.  Keep all follow-up visits as told by your health care provider. This is important.  Return to your normal activities as told by your health care provider. Ask your health care provider what activities are safe for you. How is this prevented?     Adenoviruses often are not killed by cleaning products and can remain on surfaces for a long time. To help prevent becoming infected or spreading infection:  Wash your hands often with soap and water. If soap and water are not available, use hand sanitizer.  Cover your mouth when you cough. Cover your nose and mouth when you sneeze.  Do not touch your eyes, nose, or mouth with unwashed hands, and wash hands after touching these areas.  Clean commonly used objects often.  Do not use a swimming pool that does not have enough chlorine in it.  Avoid close contact with people who are sick.  Do not go to school or work when you are sick.  Do not share cups or eating utensils. Where to find more information  Centers for Disease Control and Prevention: www.cdc.gov Contact a health care provider if:  Your symptoms stay the same after 10 days.  Your symptoms get worse.  You cannot eat or drink without vomiting. Get help right away if:  You have trouble breathing or you are breathing quickly.  Your skin, lips, or fingernails look blue.  Your heart is beating fast.  You become confused.  You lose consciousness. These symptoms may represent a serious problem that is an emergency. Do not wait to see if the symptoms will go away. Get medical help right away. Call your local emergency services (911 in the [place] Do not drive yourself to the hospital. Summary  The most common type of adenovirus infection is the common cold.  This condition goes away on its own with time.  Adenoviruses can live outside the body for a long time. They spread easily from person to person (are contagious).  Rest at home  until your symptoms go away.  Contact a health care provider if your symptoms stay the same after 10 days. This information is not intended to replace advice given to you by your health care provider. Make sure you discuss any questions you have with your health care provider. Document Revised: 12/25/2018 Document Reviewed: 12/25/2018 Elsevier Patient Education  2020 [employer]  

## 2020-07-13 ENCOUNTER — Other Ambulatory Visit: Payer: Self-pay

## 2020-07-13 MED ORDER — PROMETHAZINE HCL 25 MG PO TABS: ORAL_TABLET | ORAL | 0 refills | Status: DC

## 2020-07-13 NOTE — Telephone Encounter (Signed)
Patient last seen in June for routine visit, had acute in December. Told at June visit to f/up in 1 year. Previous [facility] patient.

## 2020-07-13 NOTE — Telephone Encounter (Signed)
Pt stated will call to make apt declined apts at this time.

## 2020-08-13 LAB — HM MAMMOGRAPHY

## 2020-12-02 ENCOUNTER — Other Ambulatory Visit: Payer: Self-pay | Admitting: Nurse Practitioner

## 2020-12-02 NOTE — Telephone Encounter (Signed)
No scheduled appointment. Approved per protocol.

## 2020-12-08 ENCOUNTER — Other Ambulatory Visit: Payer: Self-pay | Admitting: Family Medicine

## 2020-12-08 NOTE — Telephone Encounter (Signed)
Approved per protocol.  

## 2020-12-21 NOTE — Progress Notes (Signed)
BP 113/63   Pulse 92   Temp 99.1 F (37.3 C)   Ht 5' 5.16" (1.655 m)   Wt 162 lb 2 oz (73.5 kg)   SpO2 99%   BMI 26.85 kg/m    Subjective:    Patient ID: Ruth Hammond, female    DOB: 12/05/1970, 50 y.o.   MRN: [id number]  HPI: Ruth Hammond is a 50 y.o. female presenting on 12/22/2020 for comprehensive medical examination. Current medical complaints include:none  She currently lives with: Menopausal Symptoms: no  MIGRAINES Patient states she was started on the Maxalt and gets about 10 per month.  Patient was given the Relpax to use if she runs out of the 10 tabs per month.  She does have nausea with the migraines. Uses about 10 Maxalt per month.  Rarely uses the Relpax.  DEPRESSION/ANXIETY Patient denies any concerns about her mood.  States the effexor is working well.  Denies SI.    Denies CP, SOB, dizziness, palpitations, visual changes, and lower extremity swelling.   Depression Screen done today and results listed below:  Depression screen [facility] 2/9 12/22/2020 06/08/2020 11/27/2019 03/27/2019 01/07/2018  Decreased Interest 0 0 0 0 0  Down, Depressed, Hopeless 0 0 0 0 0  PHQ - 2 Score 0 0 0 0 0  Altered sleeping 0 0 0 0 0  Tired, decreased energy 0 0 0 0 0  Change in appetite 0 0 0 0 0  Feeling bad or failure about yourself  0 0 0 0 0  Trouble concentrating 0 0 0 0 0  Moving slowly or fidgety/restless 0 0 0 0 0  Suicidal thoughts 0 0 0 0 0  PHQ-9 Score 0 0 0 0 0  Difficult doing work/chores Not difficult at all Not difficult at all - Somewhat difficult -    The patient does not have a history of falls. I did complete a risk assessment for falls. A plan of care for falls was documented.   Past Medical History:  Past Medical History:  Diagnosis Date   Anxiety    Depression     Surgical History:  Past Surgical History:  Procedure Laterality Date   DILATION AND CURETTAGE OF UTERUS     neck fusion  10/2015   TUBAL LIGATION     WISDOM TOOTH EXTRACTION       Medications:  Current Outpatient Medications on File Prior to Visit  Medication Sig   vitamin C (ASCORBIC ACID) 250 MG tablet Take 250 mg by mouth daily.   Zinc 30 MG CAPS Take by mouth.   Cyanocobalamin (VITAMIN B 12 PO) Take by mouth daily.   ibuprofen (ADVIL,MOTRIN) 100 MG/5ML suspension Take 200 mg by mouth daily.   Multiple Vitamin (MULTIVITAMIN) tablet Take 1 tablet by mouth daily.   nitrofurantoin, macrocrystal-monohydrate, (MACROBID) 100 MG capsule Take 1 capsule (100 mg total) by mouth 2 (two) times daily.   Sulfacetamide Sodium, Acne, 10 % LOTN APP AA ON FACE AND LIPS BID UNTIL CLEAR (Patient not taking: Reported on 12/22/2020)   No current facility-administered medications on file prior to visit.    Allergies:  Allergies  Allergen Reactions   Other Swelling    Bee stings    Social History:  Social History   Socioeconomic History   Marital status: Married    Spouse name: Not on file   Number of children: Not on file   Years of education: Not on file   Highest education level: Not on  file  Occupational History   Not on file  Tobacco Use   Smoking status: Never   Smokeless tobacco: Never  Vaping Use   Vaping Use: Never used  Substance and Sexual Activity   Alcohol use: No   Drug use: No   Sexual activity: Not on file  Other Topics Concern   Not on file  Social History Narrative   Not on file   Social Determinants of Health   Financial Resource Strain: Not on file  Food Insecurity: Not on file  Transportation Needs: Not on file  Physical Activity: Not on file  Stress: Not on file  Social Connections: Not on file  Intimate Partner Violence: Not on file   Social History   Tobacco Use  Smoking Status Never  Smokeless Tobacco Never   Social History   Substance and Sexual Activity  Alcohol Use No    Family History:  Family History  Problem Relation Age of Onset   Hyperlipidemia Mother    Hypertension Mother    Stroke Mother    Diabetes  Mother    Heart disease Mother    Cancer Father        bone   Hyperlipidemia Sister    Hypertension Sister    Stroke Sister    Cancer Maternal Grandmother        ovarian or cervical not sure   Breast cancer Neg Hx     Past medical history, surgical history, medications, allergies, family history and social history reviewed with patient today and changes made to appropriate areas of the chart.   Review of Systems  Eyes:  Negative for blurred vision and double vision.  Respiratory:  Negative for shortness of breath.   Cardiovascular:  Negative for chest pain, palpitations and leg swelling.  Neurological:  Positive for headaches. Negative for dizziness.  Psychiatric/Behavioral:  Negative for depression and suicidal ideas. The patient is not nervous/anxious.   All other ROS negative except what is listed above and in the HPI.      Objective:    BP 113/63   Pulse 92   Temp 99.1 F (37.3 C)   Ht 5' 5.16" (1.655 m)   Wt 162 lb 2 oz (73.5 kg)   SpO2 99%   BMI 26.85 kg/m   Wt Readings from Last 3 Encounters:  12/22/20 162 lb 2 oz (73.5 kg)  06/08/20 158 lb 8 oz (71.9 kg)  11/27/19 154 lb (69.9 kg)    Physical Exam Vitals and nursing note reviewed.  Constitutional:      General: She is awake. She is not in acute distress.    Appearance: She is well-developed. She is not ill-appearing.  HENT:     Head: Normocephalic and atraumatic.     Right Ear: Hearing, tympanic membrane, ear canal and external ear normal. No drainage.     Left Ear: Hearing, tympanic membrane, ear canal and external ear normal. No drainage.     Nose: Nose normal.     Right Sinus: No maxillary sinus tenderness or frontal sinus tenderness.     Left Sinus: No maxillary sinus tenderness or frontal sinus tenderness.     Mouth/Throat:     Mouth: Mucous membranes are moist.     Pharynx: Oropharynx is clear. Uvula midline. No pharyngeal swelling, oropharyngeal exudate or posterior oropharyngeal erythema.  Eyes:      General: Lids are normal.        Right eye: No discharge.  Left eye: No discharge.     Extraocular Movements: Extraocular movements intact.     Conjunctiva/sclera: Conjunctivae normal.     Pupils: Pupils are equal, round, and reactive to light.     Visual Fields: Right eye visual fields normal and left eye visual fields normal.  Neck:     Thyroid: No thyromegaly.     Vascular: No carotid bruit.     Trachea: Trachea normal.  Cardiovascular:     Rate and Rhythm: Normal rate and regular rhythm.     Heart sounds: Normal heart sounds. No murmur heard.   No gallop.  Pulmonary:     Effort: Pulmonary effort is normal. No accessory muscle usage or respiratory distress.     Breath sounds: Normal breath sounds.  Chest:  Breasts:    Right: Normal. No axillary adenopathy or supraclavicular adenopathy.     Left: Normal. No axillary adenopathy or supraclavicular adenopathy.  Abdominal:     General: Bowel sounds are normal.     Palpations: Abdomen is soft. There is no hepatomegaly or splenomegaly.     Tenderness: There is no abdominal tenderness.  Musculoskeletal:        General: Normal range of motion.     Cervical back: Normal range of motion and neck supple.     Right lower leg: No edema.     Left lower leg: No edema.  Lymphadenopathy:     Head:     Right side of head: No submental, submandibular, tonsillar, preauricular or posterior auricular adenopathy.     Left side of head: No submental, submandibular, tonsillar, preauricular or posterior auricular adenopathy.     Cervical: No cervical adenopathy.     Upper Body:     Right upper body: No supraclavicular, axillary or pectoral adenopathy.     Left upper body: No supraclavicular, axillary or pectoral adenopathy.  Skin:    General: Skin is warm and dry.     Capillary Refill: Capillary refill takes less than 2 seconds.     Findings: No rash.  Neurological:     Mental Status: She is alert and oriented to person, place, and  time.     Cranial Nerves: Cranial nerves are intact.     Gait: Gait is intact.     Deep Tendon Reflexes: Reflexes are normal and symmetric.     Reflex Scores:      Brachioradialis reflexes are 2+ on the right side and 2+ on the left side.      Patellar reflexes are 2+ on the right side and 2+ on the left side. Psychiatric:        Attention and Perception: Attention normal.        Mood and Affect: Mood normal.        Speech: Speech normal.        Behavior: Behavior normal. Behavior is cooperative.        Thought Content: Thought content normal.        Judgment: Judgment normal.    Results for orders placed or performed in visit on 11/27/19  HIV Antibody (routine testing w rflx)  Result Value Ref Range   HIV Screen 4th Generation wRfx Non Reactive Non Reactive  CBC with Differential/Platelet  Result Value Ref Range   WBC 5.4 3.4 - 10.8 x10E3/uL   RBC 4.25 3.77 - 5.28 x10E6/uL   Hemoglobin 12.9 11.1 - 15.9 g/dL   Hematocrit 47.6 54.6 - 46.6 %   MCV 90 79 - 97 fL  MCH 30.4 26.6 - 33.0 pg   MCHC 33.8 31.5 - 35.7 g/dL   RDW 97.6 73.4 - 19.3 %   Platelets 260 150 - 450 x10E3/uL   Neutrophils 65 Not Estab. %   Lymphs 22 Not Estab. %   Monocytes 9 Not Estab. %   Eos 3 Not Estab. %   Basos 1 Not Estab. %   Neutrophils Absolute 3.5 1.4 - 7.0 x10E3/uL   Lymphocytes Absolute 1.2 0.7 - 3.1 x10E3/uL   Monocytes Absolute 0.5 0.1 - 0.9 x10E3/uL   EOS (ABSOLUTE) 0.2 0.0 - 0.4 x10E3/uL   Basophils Absolute 0.1 0.0 - 0.2 x10E3/uL   Immature Granulocytes 0 Not Estab. %   Immature Grans (Abs) 0.0 0.0 - 0.1 x10E3/uL  Comprehensive metabolic panel  Result Value Ref Range   Glucose 85 65 - 99 mg/dL   BUN 31 (H) 6 - 24 mg/dL   Creatinine, Ser 7.90 0.57 - 1.00 mg/dL   GFR calc non Af Amer 93 >59 mL/min/1.73   GFR calc Af Amer 107 >59 mL/min/1.73   BUN/Creatinine Ratio 41 (H) 9 - 23   Sodium 139 134 - 144 mmol/L   Potassium 4.6 3.5 - 5.2 mmol/L   Chloride 104 96 - 106 mmol/L   CO2 22 20 -  29 mmol/L   Calcium 9.3 8.7 - 10.2 mg/dL   Total Protein 6.6 6.0 - 8.5 g/dL   Albumin 4.3 3.8 - 4.8 g/dL   Globulin, Total 2.3 1.5 - 4.5 g/dL   Albumin/Globulin Ratio 1.9 1.2 - 2.2   Bilirubin Total <0.2 0.0 - 1.2 mg/dL   Alkaline Phosphatase 43 (L) 48 - 121 IU/L   AST 20 0 - 40 IU/L   ALT 20 0 - 32 IU/L  Lipid Panel w/o Chol/HDL Ratio  Result Value Ref Range   Cholesterol, Total 180 100 - 199 mg/dL   Triglycerides 95 0 - 149 mg/dL   HDL 63 >24 mg/dL   VLDL Cholesterol Cal 17 5 - 40 mg/dL   LDL Chol Calc (NIH) 097 (H) 0 - 99 mg/dL  VITAMIN D 25 Hydroxy (Vit-D Deficiency, Fractures)  Result Value Ref Range   Vit D, 25-Hydroxy 51.5 30.0 - 100.0 ng/mL  TSH  Result Value Ref Range   TSH 1.920 0.450 - 4.500 uIU/mL  UA/M w/rflx Culture, Routine   Specimen: Blood   BLD  Result Value Ref Range   Specific Gravity, UA 1.020 1.005 - 1.030   pH, UA 6.5 5.0 - 7.5   Color, UA Yellow Yellow   Appearance Ur Clear Clear   Leukocytes,UA Negative Negative   Protein,UA Negative Negative/Trace   Glucose, UA Negative Negative   Ketones, UA Negative Negative   RBC, UA Negative Negative   Bilirubin, UA Negative Negative   Urobilinogen, Ur 0.2 0.2 - 1.0 mg/dL   Nitrite, UA Negative Negative  Vitamin B12  Result Value Ref Range   Vitamin B-12 1,111 232 - 1,245 pg/mL  Hepatitis C antibody  Result Value Ref Range   Hep C Virus Ab <0.1 0.0 - 0.9 s/co ratio      Assessment & Plan:   Problem List Items Addressed This Visit       Other   Anxiety    Chronic.  Controlled.  Continue with current medication regimen.  Labs ordered today.  Refills given. Return to clinic in 6 months for reevaluation.  Call sooner if concerns arise.         Relevant Medications   venlafaxine (  EFFEXOR) 37.5 MG tablet   Depression    Chronic.  Controlled.  Continue with current medication regimen.  Labs ordered today.  Refills given. Return to clinic in 6 months for reevaluation.  Call sooner if concerns arise.         Relevant Medications   venlafaxine (EFFEXOR) 37.5 MG tablet   Other Visit Diagnoses     Annual physical exam    -  Primary   Health maintenance reviewed during visit. Labs ordered today. Colonoscopy ordered. Shingrix given.    Relevant Orders   CBC with Differential/Platelet   Comprehensive metabolic panel   Lipid panel   TSH   Urinalysis, Routine w reflex microscopic   Encounter for screening mammogram for malignant neoplasm of breast       Already had mammogram done this year by GYN.   Screening for colon cancer       Relevant Orders   Ambulatory referral to Gastroenterology        Follow up plan: Return in about 1 year (around 12/22/2021) for Physical and Fasting labs.   LABORATORY TESTING:  - Pap smear: pap done  IMMUNIZATIONS:   - Tdap: Tetanus vaccination status reviewed: last tetanus booster within 10 years. - Influenza: Up to date - Pneumovax: Not applicable - Prevnar: Not applicable - HPV: Not applicable - Zostavax vaccine: Given today  SCREENING: -Mammogram: Ordered today  - Colonoscopy: Ordered today  - Bone Density: Not applicable  -Hearing Test: Not applicable  -Spirometry: Not applicable   PATIENT COUNSELING:   Advised to take 1 mg of folate supplement per day if capable of pregnancy.   Sexuality: Discussed sexually transmitted diseases, partner selection, use of condoms, avoidance of unintended pregnancy  and contraceptive alternatives.   Advised to avoid cigarette smoking.  I discussed with the patient that most people either abstain from alcohol or drink within safe limits (<=14/week and <=4 drinks/occasion for males, <=7/weeks and <= 3 drinks/occasion for females) and that the risk for alcohol disorders and other health effects rises proportionally with the number of drinks per week and how often a drinker exceeds daily limits.  Discussed cessation/primary prevention of drug use and availability of treatment for abuse.   Diet:  Encouraged to adjust caloric intake to maintain  or achieve ideal body weight, to reduce intake of dietary saturated fat and total fat, to limit sodium intake by avoiding high sodium foods and not adding table salt, and to maintain adequate dietary potassium and calcium preferably from fresh fruits, vegetables, and low-fat dairy products.    stressed the importance of regular exercise  Injury prevention: Discussed safety belts, safety helmets, smoke detector, smoking near bedding or upholstery.   Dental health: Discussed importance of regular tooth brushing, flossing, and dental visits.    NEXT PREVENTATIVE PHYSICAL DUE IN 1 YEAR. Return in about 1 year (around 12/22/2021) for Physical and Fasting labs.

## 2020-12-22 ENCOUNTER — Other Ambulatory Visit: Payer: Self-pay

## 2020-12-22 ENCOUNTER — Encounter: Payer: Self-pay | Admitting: Nurse Practitioner

## 2020-12-22 ENCOUNTER — Ambulatory Visit (INDEPENDENT_AMBULATORY_CARE_PROVIDER_SITE_OTHER): Payer: BC Managed Care – PPO | Admitting: Nurse Practitioner

## 2020-12-22 VITALS — BP 113/63 | HR 92 | Temp 99.1°F | Ht 65.16 in | Wt 162.1 lb

## 2020-12-22 DIAGNOSIS — F419 Anxiety disorder, unspecified: Secondary | ICD-10-CM | POA: Diagnosis not present

## 2020-12-22 DIAGNOSIS — Z0000 Encounter for general adult medical examination without abnormal findings: Secondary | ICD-10-CM | POA: Diagnosis not present

## 2020-12-22 DIAGNOSIS — Z23 Encounter for immunization: Secondary | ICD-10-CM | POA: Diagnosis not present

## 2020-12-22 DIAGNOSIS — Z1211 Encounter for screening for malignant neoplasm of colon: Secondary | ICD-10-CM

## 2020-12-22 DIAGNOSIS — F330 Major depressive disorder, recurrent, mild: Secondary | ICD-10-CM | POA: Diagnosis not present

## 2020-12-22 DIAGNOSIS — Z1231 Encounter for screening mammogram for malignant neoplasm of breast: Secondary | ICD-10-CM | POA: Diagnosis not present

## 2020-12-22 LAB — URINALYSIS, ROUTINE W REFLEX MICROSCOPIC
Bilirubin, UA: NEGATIVE
Glucose, UA: NEGATIVE
Ketones, UA: NEGATIVE
Leukocytes,UA: NEGATIVE
Nitrite, UA: NEGATIVE
Protein,UA: NEGATIVE
RBC, UA: NEGATIVE
Specific Gravity, UA: <1.005 — ABNORMAL LOW (ref 1.005–1.030)
Urobilinogen, Ur: 0.2 mg/dL (ref 0.2–1.0)
pH, UA: 6 (ref 5.0–7.5)

## 2020-12-22 MED ORDER — RIZATRIPTAN BENZOATE 10 MG PO TABS: ORAL_TABLET | ORAL | 0 refills | Status: DC

## 2020-12-22 MED ORDER — PROMETHAZINE HCL 25 MG PO TABS: ORAL_TABLET | ORAL | 0 refills | Status: DC

## 2020-12-22 MED ORDER — ELETRIPTAN HYDROBROMIDE 40 MG PO TABS: ORAL_TABLET | ORAL | 0 refills | Status: DC

## 2020-12-22 MED ORDER — VENLAFAXINE HCL 37.5 MG PO TABS: ORAL_TABLET | ORAL | 3 refills | Status: DC

## 2020-12-22 NOTE — Assessment & Plan Note (Signed)
Chronic.  Controlled.  Continue with current medication regimen.  Labs ordered today.  Refills given. Return to clinic in 6 months for reevaluation.  Call sooner if concerns arise.  

## 2020-12-22 NOTE — Assessment & Plan Note (Signed)
Chronic.  Controlled.  Continue with current medication regimen.  Labs ordered today.  Refills given. Return to clinic in 6 months for reevaluation.  Call sooner if concerns arise.

## 2020-12-23 LAB — COMPREHENSIVE METABOLIC PANEL
ALT: 17 IU/L (ref 0–32)
AST: 14 IU/L (ref 0–40)
Albumin/Globulin Ratio: 2.1 (ref 1.2–2.2)
Albumin: 4.5 g/dL (ref 3.8–4.9)
Alkaline Phosphatase: 42 IU/L — ABNORMAL LOW (ref 44–121)
BUN/Creatinine Ratio: 33 — ABNORMAL HIGH (ref 9–23)
BUN: 23 mg/dL (ref 6–24)
Bilirubin Total: 0.3 mg/dL (ref 0.0–1.2)
CO2: 21 mmol/L (ref 20–29)
Calcium: 9.6 mg/dL (ref 8.7–10.2)
Chloride: 104 mmol/L (ref 96–106)
Creatinine, Ser: 0.7 mg/dL (ref 0.57–1.00)
Globulin, Total: 2.1 g/dL (ref 1.5–4.5)
Glucose: 81 mg/dL (ref 65–99)
Potassium: 4.6 mmol/L (ref 3.5–5.2)
Sodium: 140 mmol/L (ref 134–144)
Total Protein: 6.6 g/dL (ref 6.0–8.5)
eGFR: 105 mL/min/{1.73_m2} (ref >59)

## 2020-12-23 LAB — CBC WITH DIFFERENTIAL/PLATELET
Basophils Absolute: 0.1 10*3/uL (ref 0.0–0.2)
Basos: 1 %
EOS (ABSOLUTE): 0.2 10*3/uL (ref 0.0–0.4)
Eos: 3 %
Hematocrit: 38.7 % (ref 34.0–46.6)
Hemoglobin: 12.7 g/dL (ref 11.1–15.9)
Immature Grans (Abs): 0 10*3/uL (ref 0.0–0.1)
Immature Granulocytes: 0 %
Lymphocytes Absolute: 1.3 10*3/uL (ref 0.7–3.1)
Lymphs: 23 %
MCH: 30.2 pg (ref 26.6–33.0)
MCHC: 32.8 g/dL (ref 31.5–35.7)
MCV: 92 fL (ref 79–97)
Monocytes Absolute: 0.4 10*3/uL (ref 0.1–0.9)
Monocytes: 7 %
Neutrophils Absolute: 3.5 10*3/uL (ref 1.4–7.0)
Neutrophils: 66 %
Platelets: 251 10*3/uL (ref 150–450)
RBC: 4.2 x10E6/uL (ref 3.77–5.28)
RDW: 13.4 % (ref 11.7–15.4)
WBC: 5.4 10*3/uL (ref 3.4–10.8)

## 2020-12-23 LAB — LIPID PANEL
Chol/HDL Ratio: 3.6 ratio (ref 0.0–4.4)
Cholesterol, Total: 199 mg/dL (ref 100–199)
HDL: 55 mg/dL (ref >39)
LDL Chol Calc (NIH): 128 mg/dL — ABNORMAL HIGH (ref 0–99)
Triglycerides: 87 mg/dL (ref 0–149)
VLDL Cholesterol Cal: 16 mg/dL (ref 5–40)

## 2020-12-23 LAB — TSH: TSH: 2.15 u[IU]/mL (ref 0.450–4.500)

## 2020-12-23 NOTE — Progress Notes (Signed)
Hi Ruth Hammond, it was nice to meet you yesterday.  Your lab work looks good.  Cholesterol is slightly elevated from prior.  Recommend you follow a low fat diet and exercise. Otherwise I will see you at our next visit.

## 2020-12-28 ENCOUNTER — Other Ambulatory Visit: Payer: Self-pay

## 2020-12-28 MED ORDER — CLENPIQ 10-3.5-12 MG-GM -GM/160ML PO SOLN: 320.0000 mL | Freq: Once | ORAL | 0 refills | Status: AC

## 2021-01-03 ENCOUNTER — Encounter: Payer: Self-pay | Admitting: Gastroenterology

## 2021-01-04 ENCOUNTER — Encounter: Payer: Self-pay | Admitting: Gastroenterology

## 2021-01-04 ENCOUNTER — Ambulatory Visit: Payer: BC Managed Care – PPO | Admitting: Anesthesiology

## 2021-01-04 ENCOUNTER — Encounter
Admission: RE | Disposition: A | Discharge status: Home / Self Care | Payer: Self-pay | Source: Home / Self Care | Attending: Gastroenterology

## 2021-01-04 ENCOUNTER — Ambulatory Visit
Admission: RE | Admit: 2021-01-04 | Discharge: 2021-01-04 | Disposition: A | Discharge status: Home / Self Care | Payer: BC Managed Care – PPO | Attending: Gastroenterology | Admitting: Gastroenterology

## 2021-01-04 DIAGNOSIS — Z833 Family history of diabetes mellitus: Secondary | ICD-10-CM | POA: Diagnosis not present

## 2021-01-04 DIAGNOSIS — Z79899 Other long term (current) drug therapy: Secondary | ICD-10-CM | POA: Diagnosis not present

## 2021-01-04 DIAGNOSIS — Z1211 Encounter for screening for malignant neoplasm of colon: Secondary | ICD-10-CM | POA: Diagnosis present

## 2021-01-04 DIAGNOSIS — Z8349 Family history of other endocrine, nutritional and metabolic diseases: Secondary | ICD-10-CM | POA: Diagnosis not present

## 2021-01-04 DIAGNOSIS — Z791 Long term (current) use of non-steroidal anti-inflammatories (NSAID): Secondary | ICD-10-CM | POA: Diagnosis not present

## 2021-01-04 DIAGNOSIS — Z823 Family history of stroke: Secondary | ICD-10-CM | POA: Insufficient documentation

## 2021-01-04 DIAGNOSIS — Z8249 Family history of ischemic heart disease and other diseases of the circulatory system: Secondary | ICD-10-CM | POA: Insufficient documentation

## 2021-01-04 DIAGNOSIS — Z808 Family history of malignant neoplasm of other organs or systems: Secondary | ICD-10-CM | POA: Diagnosis not present

## 2021-01-04 DIAGNOSIS — Z8049 Family history of malignant neoplasm of other genital organs: Secondary | ICD-10-CM | POA: Diagnosis not present

## 2021-01-04 DIAGNOSIS — Z809 Family history of malignant neoplasm, unspecified: Secondary | ICD-10-CM | POA: Insufficient documentation

## 2021-01-04 HISTORY — DX: Other specified postprocedural states: Z98.890

## 2021-01-04 HISTORY — PX: COLONOSCOPY: SHX5424

## 2021-01-04 HISTORY — DX: Other complications of anesthesia, initial encounter: T88.59XA

## 2021-01-04 HISTORY — DX: Nausea with vomiting, unspecified: R11.2

## 2021-01-04 LAB — POCT PREGNANCY, URINE: Preg Test, Ur: NEGATIVE

## 2021-01-04 SURGERY — COLONOSCOPY
Anesthesia: General

## 2021-01-04 MED ORDER — PROPOFOL 10 MG/ML IV BOLUS
INTRAVENOUS | Status: DC | PRN
  Administered 2021-01-04: 70 mg via INTRAVENOUS

## 2021-01-04 MED ORDER — SODIUM CHLORIDE 0.9 % IV SOLN: INTRAVENOUS | Status: DC

## 2021-01-04 MED ORDER — LIDOCAINE HCL (CARDIAC) PF 100 MG/5ML IV SOSY
PREFILLED_SYRINGE | INTRAVENOUS | Status: DC | PRN
  Administered 2021-01-04: 50 mg via INTRAVENOUS

## 2021-01-04 MED ORDER — PROPOFOL 500 MG/50ML IV EMUL
INTRAVENOUS | Status: DC | PRN
  Administered 2021-01-04: 150 ug/kg/min via INTRAVENOUS

## 2021-01-04 NOTE — H&P (Signed)
Wyline Mood, MD [address], [address], [place], [place], [address] [address], [place], [place], [address] Phone: [phone]  Fax: [phone]  Primary Care Physician:  Larae Grooms, NP   Pre-Procedure History & Physical: HPI:  Ruth Hammond is a 50 y.o. female is here for an colonoscopy.   Past Medical History:  Diagnosis Date   Anxiety    Complication of anesthesia    Depression    PONV (postoperative nausea and vomiting)     Past Surgical History:  Procedure Laterality Date   DILATION AND CURETTAGE OF UTERUS     neck fusion  10/2015   TUBAL LIGATION     WISDOM TOOTH EXTRACTION      Prior to Admission medications   Medication Sig Start Date End Date Taking? Authorizing Provider  Cyanocobalamin (VITAMIN B 12 PO) Take by mouth daily.   Yes [provider]  Multiple Vitamin (MULTIVITAMIN) tablet Take 1 tablet by mouth daily.   Yes [provider]  venlafaxine (EFFEXOR) 37.5 MG tablet Take one tab daily. 12/22/20  Yes Larae Grooms, NP  vitamin C (ASCORBIC ACID) 250 MG tablet Take 250 mg by mouth daily.   Yes [provider]  Zinc 30 MG CAPS Take by mouth.   Yes [provider]  eletriptan (RELPAX) 40 MG tablet TAKE 1 TABLET BY MOUTH AS NEEDED FOR MIGRAINE OR HEADACHE. MAY REPEAT IN 2 HOURS IF HEADACHE PERSISTS OR RECURS. 12/22/20   Larae Grooms, NP  ibuprofen (ADVIL,MOTRIN) 100 MG/5ML suspension Take 200 mg by mouth daily.    [provider]  nitrofurantoin, macrocrystal-monohydrate, (MACROBID) 100 MG capsule Take 1 capsule (100 mg total) by mouth 2 (two) times daily. 08/04/19   Particia Nearing, PA-C  promethazine (PHENERGAN) 25 MG tablet TAKE 1 TABLET(25 MG) BY MOUTH EVERY 8 HOURS AS NEEDED FOR NAUSEA OR VOMITING 12/22/20   Larae Grooms, NP  rizatriptan (MAXALT) 10 MG tablet TAKE 1 TABLET BY MOUTH AS DIRECTED AS NEEDED FOR MIGRAINE: MAY REPEAT IN 2 HOURS IF NEEDED 12/22/20   Larae Grooms, NP  Sulfacetamide Sodium, Acne, 10 % LOTN APP AA ON FACE AND LIPS BID UNTIL CLEAR Patient not taking: Reported on 12/22/2020 12/11/17   [provider]    Allergies as of 12/27/2020 - Review Complete 12/22/2020  Allergen Reaction Noted   Other Swelling 11/27/2019    Family History  Problem Relation Age of Onset   Hyperlipidemia Mother    Hypertension Mother    Stroke Mother    Diabetes Mother    Heart disease Mother    Cancer Father        bone   Hyperlipidemia Sister    Hypertension Sister    Stroke Sister    Cancer Maternal Grandmother        ovarian or cervical not sure   Breast cancer Neg Hx     Social History   Socioeconomic History   Marital status: Married    Spouse name: Not on file   Number of children: Not on file   Years of education: Not on file   Highest education level: Not on file  Occupational History   Not on file  Tobacco Use   Smoking status: Never   Smokeless tobacco: Never  Vaping Use   Vaping Use: Never used  Substance and Sexual Activity   Alcohol use: No   Drug use: No   Sexual activity: Not on file  Other Topics Concern  Not on file  Social History Narrative   Not on file   Social Determinants of Health   Financial Resource Strain: Not on file  Food Insecurity: Not on file  Transportation Needs: Not on file  Physical Activity: Not on file  Stress: Not on file  Social Connections: Not on file  Intimate Partner Violence: Not on file    Review of Systems: See HPI, otherwise negative ROS  Physical Exam: BP (!) 105/92   Pulse 91   Temp 98.8 F (37.1 C) (Temporal)   Resp 16   Ht 5\' 6"  (1.676 m)   Wt 68 kg   SpO2 100%   BMI 24.21 kg/m  General:   Alert,  pleasant and cooperative in NAD Head:  Normocephalic and atraumatic. Neck:  Supple; no masses or thyromegaly. Lungs:  Clear throughout to auscultation, normal respiratory effort.    Heart:  +S1, +S2, Regular rate and rhythm, No edema. Abdomen:  Soft,  nontender and nondistended. Normal bowel sounds, without guarding, and without rebound.   Neurologic:  Alert and  oriented x4;  grossly normal neurologically.  Impression/Plan: Ruth Hammond is here for an colonoscopy to be performed for Screening colonoscopy average risk   Risks, benefits, limitations, and alternatives regarding  colonoscopy have been reviewed with the patient.  Questions have been answered.  All parties agreeable.   Murlean Iba, MD  01/04/2021, 9:34 AM

## 2021-01-04 NOTE — Anesthesia Procedure Notes (Signed)
Procedure Name: MAC Date/Time: 01/04/2021 9:48 AM Performed by: Jerrye Noble, CRNA Pre-anesthesia Checklist: Patient identified, Emergency Drugs available, Suction available and Patient being monitored Patient Re-evaluated:Patient Re-evaluated prior to induction Oxygen Delivery Method: Nasal cannula

## 2021-01-04 NOTE — Transfer of Care (Signed)
Immediate Anesthesia Transfer of Care Note  Patient: Ruth Hammond  Procedure(s) Performed: COLONOSCOPY  Patient Location: PACU and Endoscopy Unit  Anesthesia Type:General  Level of Consciousness: awake, drowsy and patient cooperative  Airway & Oxygen Therapy: Patient Spontanous Breathing  Post-op Assessment: Report given to RN and Post -op Vital signs reviewed and stable  Post vital signs: Reviewed and stable  Last Vitals:  Vitals Value Taken Time  BP 108/63 01/04/21 1015  Temp 36.5 C 01/04/21 1014  Pulse 81 01/04/21 1015  Resp 17 01/04/21 1015  SpO2 99 % 01/04/21 1015  Vitals shown include unvalidated device data.  Last Pain:  Vitals:   01/04/21 1014  TempSrc: Temporal  PainSc: 0-No pain         Complications: No notable events documented.

## 2021-01-04 NOTE — Anesthesia Postprocedure Evaluation (Signed)
Anesthesia Post Note  Patient: Ruth Hammond  Procedure(s) Performed: COLONOSCOPY  Patient location during evaluation: Phase II Anesthesia Type: General Level of consciousness: awake and alert, awake and oriented Pain management: pain level controlled Vital Signs Assessment: post-procedure vital signs reviewed and stable Respiratory status: spontaneous breathing, nonlabored ventilation and respiratory function stable Cardiovascular status: blood pressure returned to baseline and stable Postop Assessment: no apparent nausea or vomiting Anesthetic complications: no   No notable events documented.   Last Vitals:  Vitals:   01/04/21 1014 01/04/21 1024  BP: 108/63 112/86  Pulse: 88   Resp: 12   Temp: 36.5 C   SpO2: 100%     Last Pain:  Vitals:   01/04/21 1034  TempSrc:   PainSc: 0-No pain                 Manfred Arch

## 2021-01-04 NOTE — Anesthesia Preprocedure Evaluation (Signed)
Anesthesia Evaluation  Patient identified by MRN, date of birth, ID band Patient awake    Reviewed: Allergy & Precautions, NPO status , Patient's Chart, lab work & pertinent test results  History of Anesthesia Complications (+) PONV and history of anesthetic complications  Airway Mallampati: II  TM Distance: >3 FB Neck ROM: Full    Dental no notable dental hx.    Pulmonary neg pulmonary ROS,    Pulmonary exam normal        Cardiovascular negative cardio ROS Normal cardiovascular exam     Neuro/Psych  Headaches, PSYCHIATRIC DISORDERS Anxiety Depression  Neuromuscular disease    GI/Hepatic Neg liver ROS, GERD  ,  Endo/Other  negative endocrine ROS  Renal/GU negative Renal ROS  negative genitourinary   Musculoskeletal negative musculoskeletal ROS (+)   Abdominal   Peds negative pediatric ROS (+)  Hematology negative hematology ROS (+)   Anesthesia Other Findings   Reproductive/Obstetrics negative OB ROS                            Anesthesia Physical Anesthesia Plan  ASA: 2  Anesthesia Plan: General   Post-op Pain Management:    Induction: Intravenous  PONV Risk Score and Plan: 2 and Propofol infusion and TIVA  Airway Management Planned: Natural Airway and Nasal Cannula  Additional Equipment:   Intra-op Plan:   Post-operative Plan:   Informed Consent: I have reviewed the patients History and Physical, chart, labs and discussed the procedure including the risks, benefits and alternatives for the proposed anesthesia with the patient or authorized representative who has indicated his/her understanding and acceptance.       Plan Discussed with: CRNA, Anesthesiologist and Surgeon  Anesthesia Plan Comments:         Anesthesia Quick Evaluation

## 2021-01-04 NOTE — Op Note (Signed)
[facility] Gastroenterology Patient Name: Ruth Hammond Procedure Date: 01/04/2021 9:41 AM MRN: [id number] Account #: [id number] Date of Birth: 1971-06-18 Admit Type: Outpatient Age: 50 Room: [facility] ENDO ROOM 2 Gender: Female Note Status: Finalized Procedure:             Colonoscopy Indications:           Screening for colorectal malignant neoplasm Providers:             Wyline Mood MD, MD Referring MD:          No Local Md, MD (Referring MD) Medicines:             Monitored Anesthesia Care Complications:         No immediate complications. Procedure:             Pre-Anesthesia Assessment:                        - Prior to the procedure, a History and Physical was                         performed, and patient medications, allergies and                         sensitivities were reviewed. The patient's tolerance                         of previous anesthesia was reviewed.                        - The risks and benefits of the procedure and the                         sedation options and risks were discussed with the                         patient. All questions were answered and informed                         consent was obtained.                        - ASA Grade Assessment: II - A patient with mild                         systemic disease.                        After obtaining informed consent, the colonoscope was                         passed under direct vision. Throughout the procedure,                         the patient's blood pressure, pulse, and oxygen                         saturations were monitored continuously. The                         Colonoscope was introduced through the anus  and                         advanced to the the cecum, identified by the                         appendiceal orifice. The colonoscopy was performed                         with ease. The patient tolerated the procedure well.                         The quality of  the bowel preparation was excellent. Findings:      The perianal and digital rectal examinations were normal.      The entire examined colon appeared normal on direct and retroflexion       views. Impression:            - The entire examined colon is normal on direct and                         retroflexion views.                        - No specimens collected. Recommendation:        - Discharge patient to home (with escort).                        - Resume previous diet.                        - Continue present medications.                        - Repeat colonoscopy in 10 years for screening                         purposes. Procedure Code(s):     --- Professional ---                        [phone], Colonoscopy, flexible; diagnostic, including                         collection of specimen(s) by brushing or washing, when                         performed (separate procedure) Diagnosis Code(s):     --- Professional ---                        Z12.11, Encounter for screening for malignant neoplasm                         of colon CPT copyright 2019 American Medical Association. All rights reserved. The codes documented in this report are preliminary and upon coder review may  be revised to meet current compliance requirements. Wyline Mood, MD Wyline Mood MD, MD 01/04/2021 10:11:37 AM This report has been signed electronically. Number of Addenda: 0 Note Initiated On: 01/04/2021 9:41 AM Scope Withdrawal Time: 0 hours 15 minutes 27 seconds  Total Procedure Duration: 0 hours 18 minutes 55 seconds  Estimated Blood Loss:  Estimated blood loss: none.      [facility]

## 2021-01-05 ENCOUNTER — Encounter: Payer: Self-pay | Admitting: Gastroenterology

## 2021-01-29 ENCOUNTER — Other Ambulatory Visit: Payer: Self-pay | Admitting: Nurse Practitioner

## 2021-01-29 NOTE — Telephone Encounter (Signed)
Requested Prescriptions  Pending Prescriptions Disp Refills  . eletriptan (RELPAX) 40 MG tablet [Pharmacy Med Name: ELETRIPTAN 40MG  TABLETS] 10 tablet 0    Sig: TAKE 1 TABLET BY MOUTH AS NEEDED FOR MIGRAINE OR HEADACHE. MAY REPEAT IN 2 HRS IF HEADACHE PERSISTS OR RECURS     Neurology:  Migraine Therapy - Triptan Passed - 01/29/2021  5:40 PM      Passed - Last BP in normal range    BP Readings from Last 1 Encounters:  01/04/21 112/86         Passed - Valid encounter within last 12 months    Recent Outpatient Visits          1 month ago Annual physical exam   [facility], NP   7 months ago Acute bacterial conjunctivitis of both eyes   [facility], DO   1 year ago Migraine without status migrainosus, not intractable, unspecified migraine type   [facility], [facility], Salley Hews   1 year ago Need for influenza vaccination   [facility], Particia Nearing   2 years ago Abnormal chest x-ray   [facility] [place], [place]

## 2021-02-03 ENCOUNTER — Encounter: Payer: Self-pay | Admitting: Nurse Practitioner

## 2021-02-05 ENCOUNTER — Other Ambulatory Visit: Payer: Self-pay | Admitting: Nurse Practitioner

## 2021-02-05 MED ORDER — NITROFURANTOIN MONOHYD MACRO 100 MG PO CAPS: ORAL_CAPSULE | ORAL | 1 refills | Status: DC

## 2021-02-08 ENCOUNTER — Ambulatory Visit: Payer: BC Managed Care – PPO | Admitting: Nurse Practitioner

## 2021-02-15 ENCOUNTER — Other Ambulatory Visit: Payer: Self-pay

## 2021-02-15 MED ORDER — RIZATRIPTAN BENZOATE 10 MG PO TABS: ORAL_TABLET | ORAL | 0 refills | Status: DC

## 2021-02-22 MED ORDER — VENLAFAXINE HCL 37.5 MG PO TABS: ORAL_TABLET | ORAL | 1 refills | Status: DC

## 2021-03-13 ENCOUNTER — Other Ambulatory Visit: Payer: Self-pay | Admitting: Nurse Practitioner

## 2021-03-30 ENCOUNTER — Other Ambulatory Visit: Payer: Self-pay | Admitting: Nurse Practitioner

## 2021-03-30 NOTE — Telephone Encounter (Signed)
Requested Prescriptions  Pending Prescriptions Disp Refills  . eletriptan (RELPAX) 40 MG tablet [Pharmacy Med Name: ELETRIPTAN 40MG  TABLETS] 10 tablet 2    Sig: TAKE 1 TABLET BY MOUTH AS NEEDED FOR MIGRAINE OR HEADACHE. MAY REPEAT IN 2 HOURS IF HEADACHE PERSISTS OR RECURS     Neurology:  Migraine Therapy - Triptan Passed - 03/30/2021 11:50 AM      Passed - Last BP in normal range    BP Readings from Last 1 Encounters:  01/04/21 112/86         Passed - Valid encounter within last 12 months    Recent Outpatient Visits          3 months ago Annual physical exam   [facility], NP   9 months ago Acute bacterial conjunctivitis of both eyes   [facility], DO   1 year ago Migraine without status migrainosus, not intractable, unspecified migraine type   [facility], [facility], Salley Hews   2 years ago Need for influenza vaccination   [facility], Particia Nearing   2 years ago Abnormal chest x-ray   [facility] [place], [place]

## 2021-05-18 ENCOUNTER — Other Ambulatory Visit: Payer: Self-pay | Admitting: Nurse Practitioner

## 2021-05-19 NOTE — Telephone Encounter (Signed)
Requested medications are due for refill today.  yes  Requested medications are on the active medications list.  yes  Last refill. 12/22/2020  Future visit scheduled.   no  Notes to clinic.  Medication not delegated. 

## 2021-05-25 ENCOUNTER — Other Ambulatory Visit: Payer: Self-pay | Admitting: Nurse Practitioner

## 2021-05-25 NOTE — Telephone Encounter (Signed)
Requested medication (s) are due for refill today:   No.  No refills remain.   Pt requesting early refill to prevent missing any doses  Requested medication (s) are on the active medication list:   Yes  Future visit scheduled:   No   Last ordered: 02/22/2021 #180, 1 refill  Returned because no refills remain and pt requesting early refill.   Too early to fill per our protocol.   Provider to review for earlier refill.   Requested Prescriptions  Pending Prescriptions Disp Refills   venlafaxine (EFFEXOR) 37.5 MG tablet [Pharmacy Med Name: VENLAFAXINE 37.5MG  TABLETS] 180 tablet 1    Sig: TAKE 1 TABLET BY MOUTH TWICE DAILY     Psychiatry: Antidepressants - SNRI - desvenlafaxine & venlafaxine Failed - 05/25/2021  8:09 AM      Failed - LDL in normal range and within 360 days    LDL Chol Calc (NIH)  Date Value Ref Range Status  12/22/2020 128 (H) 0 - 99 mg/dL Final          Passed - Total Cholesterol in normal range and within 360 days    Cholesterol, Total  Date Value Ref Range Status  12/22/2020 199 100 - 199 mg/dL Final          Passed - Triglycerides in normal range and within 360 days    Triglycerides  Date Value Ref Range Status  12/22/2020 87 0 - 149 mg/dL Final          Passed - Completed PHQ-2 or PHQ-9 in the last 360 days      Passed - Last BP in normal range    BP Readings from Last 1 Encounters:  01/04/21 112/86          Passed - Valid encounter within last 6 months    Recent Outpatient Visits           5 months ago Annual physical exam   [facility] Larae Grooms, NP   11 months ago Acute bacterial conjunctivitis of both eyes   [facility] Caro Laroche, DO   1 year ago Migraine without status migrainosus, not intractable, unspecified migraine type   [facility], Salley Hews, [place]   2 years ago Need for influenza vaccination   [facility] Roosvelt Maser [place], [place]   2 years ago  Abnormal chest x-ray   [facility] Roosvelt Maser [place], [place]

## 2021-06-05 ENCOUNTER — Other Ambulatory Visit: Payer: Self-pay | Admitting: Nurse Practitioner

## 2021-06-06 NOTE — Telephone Encounter (Signed)
Requested Prescriptions  Pending Prescriptions Disp Refills   rizatriptan (MAXALT) 10 MG tablet [Pharmacy Med Name: RIZATRIPTAN 10MG  TABLETS] 20 tablet 0    Sig: TAKE 1 TABLET AS DIRECTED AS NEEDED FOR MIGRAINE MAY REPEAT IN 2 HRS IF NEEDED     Neurology:  Migraine Therapy - Triptan Passed - 06/05/2021 11:08 AM      Passed - Last BP in normal range    BP Readings from Last 1 Encounters:  01/04/21 112/86         Passed - Valid encounter within last 12 months    Recent Outpatient Visits          5 months ago Annual physical exam   [facility], NP   12 months ago Acute bacterial conjunctivitis of both eyes   [facility], DO   1 year ago Migraine without status migrainosus, not intractable, unspecified migraine type   [facility], [facility], Salley Hews   2 years ago Need for influenza vaccination   [facility] [place], [place]   2 years ago Abnormal chest x-ray   [facility] [place], [place]

## 2021-06-24 ENCOUNTER — Ambulatory Visit: Payer: BC Managed Care – PPO

## 2021-07-01 ENCOUNTER — Other Ambulatory Visit: Payer: Self-pay

## 2021-07-01 ENCOUNTER — Ambulatory Visit (INDEPENDENT_AMBULATORY_CARE_PROVIDER_SITE_OTHER): Payer: BC Managed Care – PPO

## 2021-07-01 DIAGNOSIS — Z23 Encounter for immunization: Secondary | ICD-10-CM | POA: Diagnosis not present

## 2021-07-09 ENCOUNTER — Other Ambulatory Visit: Payer: Self-pay | Admitting: Nurse Practitioner

## 2021-07-09 NOTE — Telephone Encounter (Signed)
Requested Prescriptions  Pending Prescriptions Disp Refills   eletriptan (RELPAX) 40 MG tablet [Pharmacy Med Name: ELETRIPTAN 40MG  TABLETS] 10 tablet 2    Sig: TAKE 1 TABLET BY MOUTH AS NEEDED FOR MIGRAINE OR HEADACHE, MAY REPEAT IN 2 HOURS IF HEADACHE PERSISTS OR RECURS     Neurology:  Migraine Therapy - Triptan Passed - 07/09/2021 12:11 PM      Passed - Last BP in normal range    BP Readings from Last 1 Encounters:  01/04/21 112/86         Passed - Valid encounter within last 12 months    Recent Outpatient Visits          6 months ago Annual physical exam   [facility] Jon Billings, NP   1 year ago Acute bacterial conjunctivitis of both eyes   [facility] Myles Gip, DO   1 year ago Migraine without status migrainosus, not intractable, unspecified migraine type   [facility], Lilia Argue, [place]   2 years ago Need for influenza vaccination   [facility] Volney American, [place]   2 years ago Abnormal chest x-ray   [place], [place], [place]

## 2021-07-21 ENCOUNTER — Ambulatory Visit: Payer: BC Managed Care – PPO | Admitting: Family Medicine

## 2021-07-21 ENCOUNTER — Encounter: Payer: Self-pay | Admitting: Family Medicine

## 2021-07-21 ENCOUNTER — Other Ambulatory Visit: Payer: Self-pay

## 2021-07-21 DIAGNOSIS — G43909 Migraine, unspecified, not intractable, without status migrainosus: Secondary | ICD-10-CM | POA: Diagnosis not present

## 2021-07-21 MED ORDER — RIZATRIPTAN BENZOATE 10 MG PO TABS: ORAL_TABLET | ORAL | 0 refills | Status: DC

## 2021-07-21 MED ORDER — TOPIRAMATE 25 MG PO TABS: ORAL_TABLET | ORAL | 1 refills | Status: DC

## 2021-07-21 MED ORDER — PROMETHAZINE HCL 25 MG PO TABS
25.0000 mg | ORAL_TABLET | Freq: Three times a day (TID) | ORAL | 1 refills | Status: DC | PRN
Start: 2021-07-21 — End: 2022-04-07

## 2021-07-21 NOTE — Progress Notes (Signed)
BP 118/80    Pulse 74    Temp 97.9 F (36.6 C)    Wt 166 lb 3.2 oz (75.4 kg)    SpO2 100%    BMI 26.83 kg/m    Subjective:    Patient ID: Ruth Hammond, female    DOB: 1971/04/25, 51 y.o.   MRN: [id number]  HPI: Ruth Hammond is a 51 y.o. female  Chief Complaint  Patient presents with   Migraine    Patient states she has noticed she was getting more frequent migraines. Patient thinks she gets at least 10 a month.    MIGRAINES- has been having issues with headaches since she had her children. About 7-8years ago she started having some pretty bad headaches about 1-2x a week. She has been on maxalt for years an it seems to be helping, but in the year since she had started it, she was running out of the medicine because she was having too many headaches a month.  Duration: chronic Onset: sudden Severity: severe Quality: sharp and severe Frequency: 10x a month Location: frontal Headache duration: goes away when she takes the meds Radiation: no Time of day headache occurs: at random Alleviating factors: tripatans Headache status at time of visit: current headache Treatments attempted: Treatments attempted: rest, ice, heat, APAP, ibuprofen, aleve", excedrine, and triptans   Aura: no Nausea:  yes Vomiting: yes Photophobia:  no Phonophobia:  no Effect on social functioning:  yes Confusion:  no Gait disturbance/ataxia:  no Behavioral changes:  no Fevers:  no  Relevant past medical, surgical, family and social history reviewed and updated as indicated. Interim medical history since our last visit reviewed. Allergies and medications reviewed and updated.  Review of Systems  Constitutional: Negative.   Respiratory: Negative.    Cardiovascular: Negative.   Gastrointestinal: Negative.   Musculoskeletal: Negative.   Neurological:  Positive for headaches. Negative for dizziness, tremors, seizures, syncope, facial asymmetry, speech difficulty, weakness, light-headedness and  numbness.  Psychiatric/Behavioral: Negative.     Per HPI unless specifically indicated above     Objective:    BP 118/80    Pulse 74    Temp 97.9 F (36.6 C)    Wt 166 lb 3.2 oz (75.4 kg)    SpO2 100%    BMI 26.83 kg/m   Wt Readings from Last 3 Encounters:  07/21/21 166 lb 3.2 oz (75.4 kg)  01/04/21 150 lb (68 kg)  12/22/20 162 lb 2 oz (73.5 kg)    Physical Exam Vitals and nursing note reviewed.  Constitutional:      General: She is not in acute distress.    Appearance: Normal appearance. She is not ill-appearing, toxic-appearing or diaphoretic.  HENT:     Head: Normocephalic and atraumatic.     Right Ear: External ear normal.     Left Ear: External ear normal.     Nose: Nose normal.     Mouth/Throat:     Mouth: Mucous membranes are moist.     Pharynx: Oropharynx is clear.  Eyes:     General: No scleral icterus.       Right eye: No discharge.        Left eye: No discharge.     Extraocular Movements: Extraocular movements intact.     Conjunctiva/sclera: Conjunctivae normal.     Pupils: Pupils are equal, round, and reactive to light.  Cardiovascular:     Rate and Rhythm: Normal rate and regular rhythm.     Pulses: Normal  pulses.     Heart sounds: Normal heart sounds. No murmur heard.   No friction rub. No gallop.  Pulmonary:     Effort: Pulmonary effort is normal. No respiratory distress.     Breath sounds: Normal breath sounds. No stridor. No wheezing, rhonchi or rales.  Chest:     Chest wall: No tenderness.  Musculoskeletal:        General: Normal range of motion.     Cervical back: Normal range of motion and neck supple.  Skin:    General: Skin is warm and dry.     Capillary Refill: Capillary refill takes less than 2 seconds.     Coloration: Skin is not jaundiced or pale.     Findings: No bruising, erythema, lesion or rash.  Neurological:     General: No focal deficit present.     Mental Status: She is alert and oriented to person, place, and time. Mental  status is at baseline.  Psychiatric:        Mood and Affect: Mood normal.        Behavior: Behavior normal.        Thought Content: Thought content normal.        Judgment: Judgment normal.    Results for orders placed or performed in visit on 02/03/21  HM MAMMOGRAPHY  Result Value Ref Range   HM Mammogram 0-4 Bi-Rad 0-4 Bi-Rad, Self Reported Normal  HM PAP SMEAR  Result Value Ref Range   HM Pap smear See results       Assessment & Plan:   Problem List Items Addressed This Visit       Cardiovascular and Mediastinum   Migraine    Not doing well. Time for preventative medicine. Will start topamax and recheck 1 month. Call with any concerns.       Relevant Medications   topiramate (TOPAMAX) 25 MG tablet   rizatriptan (MAXALT) 10 MG tablet     Follow up plan: Return in about 4 weeks (around 08/18/2021) for with PCP.

## 2021-07-21 NOTE — Assessment & Plan Note (Signed)
Not doing well. Time for preventative medicine. Will start topamax and recheck 1 month. Call with any concerns.

## 2021-08-13 ENCOUNTER — Other Ambulatory Visit: Payer: Self-pay | Admitting: Nurse Practitioner

## 2021-08-15 NOTE — Telephone Encounter (Signed)
Requested medication (s) are due for refill today: yes  Requested medication (s) are on the active medication list: yes  Last refill:  07/09/21 #10 2 refills  Future visit scheduled: yes in 4 days  Notes to clinic:  Patient requests 90 days supply. Do you want to refill Rx 90 days ?     Requested Prescriptions  Pending Prescriptions Disp Refills   eletriptan (RELPAX) 40 MG tablet [Pharmacy Med Name: ELETRIPTAN 40MG  TABLETS] 30 tablet     Sig: TAKE 1 TABLET BY MOUTH AS NEEDED FOR MIGRAINE OR HEADACHE, MAY REPEAT IN 2 HOURS IF HEADACHE PERSISTS OR RECURS     Neurology:  Migraine Therapy - Triptan Passed - 08/13/2021  5:40 PM      Passed - Last BP in normal range    BP Readings from Last 1 Encounters:  07/21/21 118/80          Passed - Valid encounter within last 12 months    Recent Outpatient Visits           3 weeks ago Migraine without status migrainosus, not intractable, unspecified migraine type   [facility] [place], Megan P, DO   7 months ago Annual physical exam   [facility], NP   1 year ago Acute bacterial conjunctivitis of both eyes   [facility], DO   1 year ago Migraine without status migrainosus, not intractable, unspecified migraine type   [facility], [facility], Salley Hews   2 years ago Need for influenza vaccination   [facility], [place] Nearing       Future Appointments             In 4 days [place], NP [facility], [facility]

## 2021-08-19 ENCOUNTER — Ambulatory Visit (INDEPENDENT_AMBULATORY_CARE_PROVIDER_SITE_OTHER): Payer: BC Managed Care – PPO | Admitting: Nurse Practitioner

## 2021-08-19 ENCOUNTER — Encounter: Payer: Self-pay | Admitting: Nurse Practitioner

## 2021-08-19 DIAGNOSIS — G43909 Migraine, unspecified, not intractable, without status migrainosus: Secondary | ICD-10-CM | POA: Diagnosis not present

## 2021-08-19 NOTE — Assessment & Plan Note (Signed)
Chronic.  Controlled.  Continue with current medication regimen of Topamax 50mg  BID.  Return to clinic in 6 months for reevaluation.  Call sooner if concerns arise.  ? ?

## 2021-08-19 NOTE — Progress Notes (Signed)
? ?There were no vitals taken for this visit.  ? ?Subjective:  ? ? Patient ID: Ruth Hammond, female    DOB: 10-15-1970, 51 y.o.   MRN: [id number] ? ?HPI: ?Ruth Hammond is a 51 y.o. female ? ?Chief Complaint  ?Patient presents with  ? URI  ?  Pt states she woke up this morning with a headache and congestion.   ? Migraine  ?  4 week f/up  ? ?MIGRAINES-  ?Patient states for most of the month she hasn't had any headaches except for today. She did start her cycle today and has been a little congested. She feels like it allergy related. She is currently taking 2 in the morning and two at night.  ? ?Duration: chronic ?Onset: sudden ?Severity: severe ?Quality: sharp and severe ?Frequency: 10x a month ?Location: frontal ?Headache duration: goes away when she takes the meds ?Radiation: no ?Time of day headache occurs: at random ?Alleviating factors: tripatans ?Headache status at time of visit: current headache ?Treatments attempted: Treatments attempted: rest, ice, heat, APAP, ibuprofen, aleve", excedrine, and triptans   ?Aura: no ?Nausea:  yes ?Vomiting: yes ?Photophobia:  no ?Phonophobia:  no ?Effect on social functioning:  yes ?Confusion:  no ?Gait disturbance/ataxia:  no ?Behavioral changes:  no ?Fevers:  no ? ? ? ?Relevant past medical, surgical, family and social history reviewed and updated as indicated. Interim medical history since our last visit reviewed. ?Allergies and medications reviewed and updated. ? ?Review of Systems  ?Constitutional: Negative.   ?Respiratory: Negative.    ?Cardiovascular: Negative.   ?Gastrointestinal: Negative.   ?Musculoskeletal: Negative.   ?Neurological:  Positive for headaches. Negative for dizziness, tremors, seizures, syncope, facial asymmetry, speech difficulty, weakness, light-headedness and numbness.  ?Psychiatric/Behavioral: Negative.    ? ?Per HPI unless specifically indicated above ? ?   ?Objective:  ?  ?There were no vitals taken for this visit.  ?Wt Readings from Last 3  Encounters:  ?07/21/21 166 lb 3.2 oz (75.4 kg)  ?01/04/21 150 lb (68 kg)  ?12/22/20 162 lb 2 oz (73.5 kg)  ?  ?Physical Exam ?Vitals and nursing note reviewed.  ?Constitutional:   ?   General: She is not in acute distress. ?   Appearance: Normal appearance. She is not ill-appearing, toxic-appearing or diaphoretic.  ?HENT:  ?   Head: Normocephalic and atraumatic.  ?   Right Ear: External ear normal.  ?   Left Ear: External ear normal.  ?   Nose: Nose normal.  ?   Mouth/Throat:  ?   Mouth: Mucous membranes are moist.  ?   Pharynx: Oropharynx is clear.  ?Eyes:  ?   General: No scleral icterus.    ?   Right eye: No discharge.     ?   Left eye: No discharge.  ?   Extraocular Movements: Extraocular movements intact.  ?   Conjunctiva/sclera: Conjunctivae normal.  ?   Pupils: Pupils are equal, round, and reactive to light.  ?Cardiovascular:  ?   Rate and Rhythm: Normal rate and regular rhythm.  ?   Pulses: Normal pulses.  ?   Heart sounds: Normal heart sounds. No murmur heard. ?  No friction rub. No gallop.  ?Pulmonary:  ?   Effort: Pulmonary effort is normal. No respiratory distress.  ?   Breath sounds: Normal breath sounds. No stridor. No wheezing, rhonchi or rales.  ?Chest:  ?   Chest wall: No tenderness.  ?Musculoskeletal:     ?   General: Normal  range of motion.  ?   Cervical back: Normal range of motion and neck supple.  ?Skin: ?   General: Skin is warm and dry.  ?   Capillary Refill: Capillary refill takes less than 2 seconds.  ?   Coloration: Skin is not jaundiced or pale.  ?   Findings: No bruising, erythema, lesion or rash.  ?Neurological:  ?   General: No focal deficit present.  ?   Mental Status: She is alert and oriented to person, place, and time. Mental status is at baseline.  ?Psychiatric:     ?   Mood and Affect: Mood normal.     ?   Behavior: Behavior normal.     ?   Thought Content: Thought content normal.     ?   Judgment: Judgment normal.  ? ? ?Results for orders placed or performed in visit on 02/03/21   ?HM MAMMOGRAPHY  ?Result Value Ref Range  ? HM Mammogram 0-4 Bi-Rad 0-4 Bi-Rad, Self Reported Normal  ?HM PAP SMEAR  ?Result Value Ref Range  ? HM Pap smear See results   ? ?   ?Assessment & Plan:  ? ?Problem List Items Addressed This Visit   ? ?  ? Cardiovascular and Mediastinum  ? Migraine - Primary  ?  Chronic.  Controlled.  Continue with current medication regimen of Topamax 50mg  BID.  Return to clinic in 6 months for reevaluation.  Call sooner if concerns arise.  ? ?  ?  ?  ? ?Follow up plan: ?Return for Physical and Fasting labs, Migraines. ? ? ?This visit was completed via MyChart due to the restrictions of the COVID-19 pandemic. All issues as above were discussed and addressed. Physical exam was done as above through visual confirmation on MyChart. If it was felt that the patient should be evaluated in the office, they were directed there. The patient verbally consented to this visit. ?Location of the patient: Home ?Location of the provider: Office ?Those involved with this call:  ?Provider: , NP ?CMA:  Larae Grooms, CMA ?Front Desk/Registration: Wilhemena Durie ?This encounter was conducted via telephone.  I spent 20 dedicated to the care of this patient on the date of this encounter to include previsit review of medications, headache status, migraines over the last month and plan of care moving forward, face to face time with the patient, and post visit ordering of testing.  ? ? ? ? ? ?

## 2021-08-22 NOTE — Progress Notes (Signed)
Pt states she will call back to schedule.

## 2021-09-01 ENCOUNTER — Other Ambulatory Visit: Payer: Self-pay | Admitting: Family Medicine

## 2021-09-01 NOTE — Telephone Encounter (Signed)
Requested Prescriptions  ?Pending Prescriptions Disp Refills  ?? topiramate (TOPAMAX) 25 MG tablet [Pharmacy Med Name: TOPIRAMATE 25MG  TABLETS] 308 tablet   ?  Sig: TAKE 1 TABLET IN THE MORNING X1 WEEK, 1 TABLET TWICE DAILY X 1 WEEK, 2 TABLETS IN THE MORNING AND 1 IN THE EVENING FOR 1 WEEK, THEN 2 TWICE DAILY  ?  ? Neurology: Anticonvulsants - topiramate & zonisamide Passed - 09/01/2021  8:09 AM  ?  ?  Passed - Cr in normal range and within 360 days  ?  Creatinine, Ser  ?Date Value Ref Range Status  ?12/22/2020 0.70 0.57 - 1.00 mg/dL Final  ?   ?  ?  Passed - CO2 in normal range and within 360 days  ?  CO2  ?Date Value Ref Range Status  ?12/22/2020 21 20 - 29 mmol/L Final  ?   ?  ?  Passed - ALT in normal range and within 360 days  ?  ALT  ?Date Value Ref Range Status  ?12/22/2020 17 0 - 32 IU/L Final  ?   ?  ?  Passed - AST in normal range and within 360 days  ?  AST  ?Date Value Ref Range Status  ?12/22/2020 14 0 - 40 IU/L Final  ?   ?  ?  Passed - Completed PHQ-2 or PHQ-9 in the last 360 days  ?  ?  Passed - Valid encounter within last 12 months  ?  Recent Outpatient Visits   ?      ? 1 week ago Migraine without status migrainosus, not intractable, unspecified migraine type  ? [facility], NP  ? 1 month ago Migraine without status migrainosus, not intractable, unspecified migraine type  ? [facility] [place], Megan P, DO  ? 8 months ago Annual physical exam  ? [facility], NP  ? 1 year ago Acute bacterial conjunctivitis of both eyes  ? [facility] M, DO  ? 1 year ago Migraine without status migrainosus, not intractable, unspecified migraine type  ? [facility], [place]icia Nearing  ?  ?  ? ?  ?  ?  ? ? ?

## 2021-09-07 LAB — HM MAMMOGRAPHY

## 2021-09-11 ENCOUNTER — Other Ambulatory Visit: Payer: Self-pay | Admitting: Nurse Practitioner

## 2021-09-13 NOTE — Telephone Encounter (Signed)
Requested Prescriptions  ?Pending Prescriptions Disp Refills  ?? eletriptan (RELPAX) 40 MG tablet [Pharmacy Med Name: ELETRIPTAN 40MG  TABLETS] 30 tablet 0  ?  Sig: TAKE 1 TABLET BY MOUTH AS NEEDED FOR MIGRAINE OR HEADACHE, MAY REPEAT IN 2 HOURS IF HEADACHE PERSISTS OR RECURS  ?  ? Neurology:  Migraine Therapy - Triptan Passed - 09/11/2021 11:36 AM  ?  ?  Passed - Last BP in normal range  ?  BP Readings from Last 1 Encounters:  ?07/21/21 118/80  ?   ?  ?  Passed - Valid encounter within last 12 months  ?  Recent Outpatient Visits   ?      ? 3 weeks ago Migraine without status migrainosus, not intractable, unspecified migraine type  ? [facility], NP  ? 1 month ago Migraine without status migrainosus, not intractable, unspecified migraine type  ? [facility] [place], Megan P, DO  ? 8 months ago Annual physical exam  ? [facility], NP  ? 1 year ago Acute bacterial conjunctivitis of both eyes  ? [facility] M, DO  ? 1 year ago Migraine without status migrainosus, not intractable, unspecified migraine type  ? [facility], Particia Nearing  ?  ?  ? ?  ?  ?  ? ? ?

## 2021-10-03 ENCOUNTER — Other Ambulatory Visit: Payer: Self-pay | Admitting: Nurse Practitioner

## 2021-10-03 MED ORDER — VENLAFAXINE HCL 37.5 MG PO TABS: ORAL_TABLET | ORAL | 1 refills | Status: DC

## 2021-10-03 NOTE — Telephone Encounter (Signed)
Copied from CRM [id number]. Topic: General - Other ?>> Oct 03, 2021  9:00 AM Gaetana Michaelis A wrote: ?Reason for CRM: The patient has concerns related to their venlafaxine (EFFEXOR) 37.5 MG tablet [[id number]]  prescription  ? ?The directions were written  as 1 tablet per day but patient previously discussed with their provider that the prescription would be for 2 tablets per day ? ?The patient currently has 1 tablet of the medication remaining  ? ?The patient uses ? ?[id number] - [place], [place] - [address] AT [facility] [address] ?[address] [place] [address] ?Phone: [phone] Fax: [phone] ?Hours: Not open 24 hours ? ?Please contact the patient further when possible ?

## 2021-10-03 NOTE — Telephone Encounter (Signed)
RX is due for refill for 1 tablet BID.  ?

## 2021-10-03 NOTE — Telephone Encounter (Signed)
Attempted to call patient - left message will forward question to provider ?Instruction on Rx is different from dosing discussed at appointment- note sent to office for review  ?

## 2021-12-06 ENCOUNTER — Other Ambulatory Visit: Payer: Self-pay | Admitting: Nurse Practitioner

## 2021-12-06 NOTE — Telephone Encounter (Signed)
Requested Prescriptions  Pending Prescriptions Disp Refills  . eletriptan (RELPAX) 40 MG tablet [Pharmacy Med Name: ELETRIPTAN 40MG  TABLETS] 30 tablet 2    Sig: TAKE 1 TABLET BY MOUTH AS NEEDED FOR MIGRAINE OR HEADACHE, MAY REPEAT IN 2 HOURS IF HEADACHE PERSISTS OR RECURS     Neurology:  Migraine Therapy - Triptan Passed - 12/06/2021  1:15 PM      Passed - Last BP in normal range    BP Readings from Last 1 Encounters:  07/21/21 118/80         Passed - Valid encounter within last 12 months    Recent Outpatient Visits          3 months ago Migraine without status migrainosus, not intractable, unspecified migraine type   [facility], NP   4 months ago Migraine without status migrainosus, not intractable, unspecified migraine type   [facility], Megan P, DO   11 months ago Annual physical exam   [facility], NP   1 year ago Acute bacterial conjunctivitis of both eyes   [facility], DO   2 years ago Migraine without status migrainosus, not intractable, unspecified migraine type   [facility], [place], [place]

## 2021-12-26 ENCOUNTER — Ambulatory Visit: Payer: Self-pay | Admitting: *Deleted

## 2021-12-26 ENCOUNTER — Other Ambulatory Visit: Payer: Self-pay | Admitting: Nurse Practitioner

## 2021-12-26 NOTE — Telephone Encounter (Signed)
Reason for Disposition . MODERATE to SEVERE itching (e.g., interferes with work, school, sleep, or other activities)  Answer Assessment - Initial Assessment Questions 1. APPEARANCE of RASH: "Describe the rash."      Blisters and oozing and  crusting 2. LOCATION: "Where is the rash located?"  (e.g., face, genitals, hands, legs)     Face and neck 3. SIZE: "How large is the rash?"      Face and neck 4. ONSET: "When did the rash begin?"      Last night 5. ITCHING: "Does the rash itch?" If Yes, ask: "How bad is it?"   - MILD - doesn't interfere with normal activities   - MODERATE-SEVERE: interferes with work, school, sleep, or other activities      Very itchy 6. EXPOSURE:  "How were you exposed to the plant (poison ivy, poison oak, sumac)"  "When were you exposed?"       unsure 7. PAST HISTORY: "Have you had a poison ivy rash before?" If Yes, ask: "How bad was it?"     Yes, very allergic 8. PREGNANCY: "Is there any chance you are pregnant?" "When was your last menstrual period?"     na  Protocols used: Poison Ivy - Oak - [facility]

## 2021-12-27 ENCOUNTER — Ambulatory Visit: Payer: BC Managed Care – PPO | Admitting: Unknown Physician Specialty

## 2021-12-27 ENCOUNTER — Encounter: Payer: Self-pay | Admitting: Unknown Physician Specialty

## 2021-12-27 VITALS — BP 116/75 | HR 88 | Temp 98.6°F | Ht 65.98 in | Wt 166.0 lb

## 2021-12-27 DIAGNOSIS — L255 Unspecified contact dermatitis due to plants, except food: Secondary | ICD-10-CM

## 2021-12-27 DIAGNOSIS — G43909 Migraine, unspecified, not intractable, without status migrainosus: Secondary | ICD-10-CM

## 2021-12-27 MED ORDER — BETAMETHASONE DIPROPIONATE AUG 0.05 % EX CREA: TOPICAL_CREAM | Freq: Two times a day (BID) | CUTANEOUS | 0 refills | Status: DC

## 2021-12-27 MED ORDER — PREDNISONE 10 MG PO TABS: 10.0000 mg | ORAL_TABLET | Freq: Every day | ORAL | 0 refills | Status: DC

## 2021-12-27 MED ORDER — TOPIRAMATE 25 MG PO TABS: ORAL_TABLET | ORAL | 0 refills | Status: DC

## 2021-12-27 NOTE — Progress Notes (Signed)
BP 116/75   Pulse 88   Temp 98.6 F (37 C) (Oral)   Ht 5' 5.98" (1.676 m)   Wt 166 lb (75.3 kg)   SpO2 98%   BMI 26.81 kg/m    Subjective:    Patient ID: Ruth Hammond, female    DOB: 1971-06-01, 51 y.o.   MRN: [id number]  HPI: Ruth Hammond is a 51 y.o. female  Chief Complaint  Patient presents with   Rash    Noticed Sunday evening, started itching, spread all over forehead and on b/l neck   Pt states she is highly allergic to poison ivy.  Started on right face and down neck.  Feels it might have come from the dog.  No SOB  Pt needs refill of Topiramate for migraines.  Pt states she is down to about 5 mg of Maxalt.    Relevant past medical, surgical, family and social history reviewed and updated as indicated. Interim medical history since our last visit reviewed. Allergies and medications reviewed and updated.  Review of Systems  Constitutional: Negative.   Respiratory: Negative.    Cardiovascular: Negative.     Per HPI unless specifically indicated above     Objective:    BP 116/75   Pulse 88   Temp 98.6 F (37 C) (Oral)   Ht 5' 5.98" (1.676 m)   Wt 166 lb (75.3 kg)   SpO2 98%   BMI 26.81 kg/m   Wt Readings from Last 3 Encounters:  12/27/21 166 lb (75.3 kg)  07/21/21 166 lb 3.2 oz (75.4 kg)  01/04/21 150 lb (68 kg)    Physical Exam Constitutional:      General: She is not in acute distress.    Appearance: Normal appearance. She is well-developed.  HENT:     Head: Normocephalic and atraumatic.  Eyes:     General: Lids are normal. No scleral icterus.       Right eye: No discharge.        Left eye: No discharge.     Conjunctiva/sclera: Conjunctivae normal.  Neck:     Vascular: No carotid bruit or JVD.  Cardiovascular:     Rate and Rhythm: Normal rate and regular rhythm.     Heart sounds: Normal heart sounds.  Pulmonary:     Effort: Pulmonary effort is normal. No respiratory distress.     Breath sounds: Normal breath sounds.  Abdominal:      Palpations: There is no hepatomegaly or splenomegaly.  Musculoskeletal:        General: Normal range of motion.     Cervical back: Normal range of motion and neck supple.  Skin:    General: Skin is warm and dry.     Coloration: Skin is not pale.     Findings: No rash.  Neurological:     Mental Status: She is alert and oriented to person, place, and time.  Psychiatric:        Behavior: Behavior normal.        Thought Content: Thought content normal.        Judgment: Judgment normal.     Results for orders placed or performed in visit on 02/03/21  HM MAMMOGRAPHY  Result Value Ref Range   HM Mammogram 0-4 Bi-Rad 0-4 Bi-Rad, Self Reported Normal  HM PAP SMEAR  Result Value Ref Range   HM Pap smear See results       Assessment & Plan:   Problem List Items Addressed This Visit  Unprioritized   Migraine    Refill Topamax.  Having half the headaches she has had      Relevant Medications   topiramate (TOPAMAX) 25 MG tablet   Other Visit Diagnoses     Rhus dermatitis    -  Primary   Prednisone taper written.  Diprolene cream for twice a day and may use it for future outbreaks.  Pt ed on preventative measures.          Follow up plan: Return Schedule for physical.

## 2021-12-27 NOTE — Assessment & Plan Note (Signed)
Refill Topamax.  Having half the headaches she has had

## 2021-12-27 NOTE — Telephone Encounter (Signed)
Refilled 12/27/21. Requested Prescriptions  Refused Prescriptions Disp Refills  . topiramate (TOPAMAX) 25 MG tablet [Pharmacy Med Name: TOPIRAMATE 25MG  TABLETS] 360 tablet 0    Sig: TAKE 1 TABLET IN THE AM FOR 1WK THEN 1 TABLET TWICE DAILY FOR 1WK 2 TABLET IN AM AND 1 IN PM FOR 1WK THEN 2 TABLET TWICE DAILY     Neurology: Anticonvulsants - topiramate & zonisamide Failed - 12/26/2021  2:16 PM      Failed - Cr in normal range and within 360 days    Creatinine, Ser  Date Value Ref Range Status  12/22/2020 0.70 0.57 - 1.00 mg/dL Final         Failed - CO2 in normal range and within 360 days    CO2  Date Value Ref Range Status  12/22/2020 21 20 - 29 mmol/L Final         Failed - ALT in normal range and within 360 days    ALT  Date Value Ref Range Status  12/22/2020 17 0 - 32 IU/L Final         Failed - AST in normal range and within 360 days    AST  Date Value Ref Range Status  12/22/2020 14 0 - 40 IU/L Final         Passed - Completed PHQ-2 or PHQ-9 in the last 360 days      Passed - Valid encounter within last 12 months    Recent Outpatient Visits          Today Rhus dermatitis   [facility] 02/22/2021, NP   4 months ago Migraine without status migrainosus, not intractable, unspecified migraine type   [facility], NP   5 months ago Migraine without status migrainosus, not intractable, unspecified migraine type   [facility] [place], [place], DO   1 year ago Annual physical exam   [facility], NP   1 year ago Acute bacterial conjunctivitis of both eyes   [facility] Larae Grooms, DO      Future Appointments            In 2 weeks Caro Laroche, NP [facility], [facility]

## 2022-01-11 NOTE — Progress Notes (Unsigned)
There were no vitals taken for this visit.   Subjective:    Patient ID: Ruth Hammond, female    DOB: 05-14-1971, 51 y.o.   MRN: [id number]  HPI: Ruth Hammond is a 51 y.o. female presenting on 01/12/2022 for comprehensive medical examination. Current medical complaints include:{Blank single:19197::"none","***"}  She currently lives with: Menopausal Symptoms: {Blank single:19197::"yes","no"}  MOOD   Depression Screen done today and results listed below:     12/27/2021   10:09 AM 07/21/2021    3:37 PM 12/22/2020    9:56 AM 06/08/2020   10:36 AM 11/27/2019    2:11 PM  Depression screen PHQ 2/9  Decreased Interest 0 0 0 0 0  Down, Depressed, Hopeless 0 0 0 0 0  PHQ - 2 Score 0 0 0 0 0  Altered sleeping 0 0 0 0 0  Tired, decreased energy 0 0 0 0 0  Change in appetite 0 0 0 0 0  Feeling bad or failure about yourself  0 0 0 0 0  Trouble concentrating 0 0 0 0 0  Moving slowly or fidgety/restless 0 0 0 0 0  Suicidal thoughts 0 0 0 0 0  PHQ-9 Score 0 0 0 0 0  Difficult doing work/chores Not difficult at all  Not difficult at all Not difficult at all     The patient {has/does not have:19849} a history of falls. I {did/did not:19850} complete a risk assessment for falls. A plan of care for falls {was/was not:19852} documented.   Past Medical History:  Past Medical History:  Diagnosis Date   Anxiety    Complication of anesthesia    Depression    PONV (postoperative nausea and vomiting)     Surgical History:  Past Surgical History:  Procedure Laterality Date   COLONOSCOPY N/A 01/04/2021   Procedure: COLONOSCOPY;  Surgeon: Wyline Mood, MD;  Location[facility] ENDOSCOPY;  Service: Gastroenterology;  Laterality: N/A;   DILATION AND CURETTAGE OF UTERUS     neck fusion  10/2015   TUBAL LIGATION     WISDOM TOOTH EXTRACTION      Medications:  Current Outpatient Medications on File Prior to Visit  Medication Sig   augmented betamethasone dipropionate (DIPROLENE AF) 0.05 % cream  Apply topically 2 (two) times daily.   Cyanocobalamin (VITAMIN B 12 PO) Take by mouth daily.   eletriptan (RELPAX) 40 MG tablet TAKE 1 TABLET BY MOUTH AS NEEDED FOR MIGRAINE OR HEADACHE, MAY REPEAT IN 2 HOURS IF HEADACHE PERSISTS OR RECURS   ibuprofen (ADVIL,MOTRIN) 100 MG/5ML suspension Take 200 mg by mouth daily.   Multiple Vitamin (MULTIVITAMIN) tablet Take 1 tablet by mouth daily.   nitrofurantoin, macrocrystal-monohydrate, (MACROBID) 100 MG capsule Take one capsule after intercourse as needed for UTI prevention.   predniSONE (DELTASONE) 10 MG tablet Take 1 tablet (10 mg total) by mouth daily with breakfast. Take on a 6-6-5-4-4-3-2-2-1-1/2-1/2   promethazine (PHENERGAN) 25 MG tablet Take 1 tablet (25 mg total) by mouth every 8 (eight) hours as needed for nausea or vomiting.   rizatriptan (MAXALT) 10 MG tablet TAKE 1 TABLET AS DIRECTED AS NEEDED FOR MIGRAINE MAY REPEAT IN 2 HRS IF NEEDED   topiramate (TOPAMAX) 25 MG tablet 2 TABLETS twice a day   venlafaxine (EFFEXOR) 37.5 MG tablet Take one tab twice daily.   vitamin C (ASCORBIC ACID) 250 MG tablet Take 250 mg by mouth daily. (Patient not taking: Reported on 12/27/2021)   Zinc 30 MG CAPS Take by mouth. (Patient not  taking: Reported on 12/27/2021)   No current facility-administered medications on file prior to visit.    Allergies:  Allergies  Allergen Reactions   Other Swelling    Bee stings    Social History:  Social History   Socioeconomic History   Marital status: Married    Spouse name: Not on file   Number of children: Not on file   Years of education: Not on file   Highest education level: Not on file  Occupational History   Not on file  Tobacco Use   Smoking status: Never   Smokeless tobacco: Never  Vaping Use   Vaping Use: Never used  Substance and Sexual Activity   Alcohol use: No   Drug use: No   Sexual activity: Not Currently  Other Topics Concern   Not on file  Social History Narrative   Not on file    Social Determinants of Health   Financial Resource Strain: Not on file  Food Insecurity: Not on file  Transportation Needs: Not on file  Physical Activity: Not on file  Stress: Not on file  Social Connections: Not on file  Intimate Partner Violence: Not on file   Social History   Tobacco Use  Smoking Status Never  Smokeless Tobacco Never   Social History   Substance and Sexual Activity  Alcohol Use No    Family History:  Family History  Problem Relation Age of Onset   Hyperlipidemia Mother    Hypertension Mother    Stroke Mother    Diabetes Mother    Heart disease Mother    Cancer Father        bone   Hyperlipidemia Sister    Hypertension Sister    Stroke Sister    Cancer Maternal Grandmother        ovarian or cervical not sure   Breast cancer Neg Hx     Past medical history, surgical history, medications, allergies, family history and social history reviewed with patient today and changes made to appropriate areas of the chart.   ROS All other ROS negative except what is listed above and in the HPI.      Objective:    There were no vitals taken for this visit.  Wt Readings from Last 3 Encounters:  12/27/21 166 lb (75.3 kg)  07/21/21 166 lb 3.2 oz (75.4 kg)  01/04/21 150 lb (68 kg)    Physical Exam  Results for orders placed or performed in visit on 02/03/21  HM MAMMOGRAPHY  Result Value Ref Range   HM Mammogram 0-4 Bi-Rad 0-4 Bi-Rad, Self Reported Normal  HM PAP SMEAR  Result Value Ref Range   HM Pap smear See results       Assessment & Plan:   Problem List Items Addressed This Visit       Other   Anxiety   Depression   Other Visit Diagnoses     Annual physical exam    -  Primary        Follow up plan: No follow-ups on file.   LABORATORY TESTING:  - Pap smear: {Blank single:19197::"pap done","not applicable","up to date","done elsewhere"}  IMMUNIZATIONS:   - Tdap: Tetanus vaccination status reviewed: {tetanus  status:315746}. - Influenza: {Blank single:19197::"Up to date","Administered today","Postponed to flu season","Refused","Given elsewhere"} - Pneumovax: {Blank single:19197::"Up to date","Administered today","Not applicable","Refused","Given elsewhere"} - Prevnar: {Blank single:19197::"Up to date","Administered today","Not applicable","Refused","Given elsewhere"} - COVID: {Blank single:19197::"Up to date","Administered today","Not applicable","Refused","Given elsewhere"} - HPV: {Blank single:19197::"Up to date","Administered today","Not applicable","Refused","Given elsewhere"} -  Shingrix vaccine: {Blank single:19197::"Up to date","Administered today","Not applicable","Refused","Given elsewhere"}  SCREENING: -Mammogram: {Blank single:19197::"Up to date","Ordered today","Not applicable","Refused","Done elsewhere"}  - Colonoscopy: {Blank single:19197::"Up to date","Ordered today","Not applicable","Refused","Done elsewhere"}  - Bone Density: {Blank single:19197::"Up to date","Ordered today","Not applicable","Refused","Done elsewhere"}  -Hearing Test: {Blank single:19197::"Up to date","Ordered today","Not applicable","Refused","Done elsewhere"}  -Spirometry: {Blank single:19197::"Up to date","Ordered today","Not applicable","Refused","Done elsewhere"}   PATIENT COUNSELING:   Advised to take 1 mg of folate supplement per day if capable of pregnancy.   Sexuality: Discussed sexually transmitted diseases, partner selection, use of condoms, avoidance of unintended pregnancy  and contraceptive alternatives.   Advised to avoid cigarette smoking.  I discussed with the patient that most people either abstain from alcohol or drink within safe limits (<=14/week and <=4 drinks/occasion for males, <=7/weeks and <= 3 drinks/occasion for females) and that the risk for alcohol disorders and other health effects rises proportionally with the number of drinks per week and how often a drinker exceeds daily  limits.  Discussed cessation/primary prevention of drug use and availability of treatment for abuse.   Diet: Encouraged to adjust caloric intake to maintain  or achieve ideal body weight, to reduce intake of dietary saturated fat and total fat, to limit sodium intake by avoiding high sodium foods and not adding table salt, and to maintain adequate dietary potassium and calcium preferably from fresh fruits, vegetables, and low-fat dairy products.    stressed the importance of regular exercise  Injury prevention: Discussed safety belts, safety helmets, smoke detector, smoking near bedding or upholstery.   Dental health: Discussed importance of regular tooth brushing, flossing, and dental visits.    NEXT PREVENTATIVE PHYSICAL DUE IN 1 YEAR. No follow-ups on file.

## 2022-01-12 ENCOUNTER — Ambulatory Visit (INDEPENDENT_AMBULATORY_CARE_PROVIDER_SITE_OTHER): Payer: BC Managed Care – PPO | Admitting: Nurse Practitioner

## 2022-01-12 ENCOUNTER — Encounter: Payer: Self-pay | Admitting: Nurse Practitioner

## 2022-01-12 VITALS — BP 110/81 | HR 81 | Temp 98.4°F | Ht 66.93 in | Wt 166.7 lb

## 2022-01-12 DIAGNOSIS — F419 Anxiety disorder, unspecified: Secondary | ICD-10-CM | POA: Diagnosis not present

## 2022-01-12 DIAGNOSIS — Z136 Encounter for screening for cardiovascular disorders: Secondary | ICD-10-CM | POA: Diagnosis not present

## 2022-01-12 DIAGNOSIS — Z0000 Encounter for general adult medical examination without abnormal findings: Secondary | ICD-10-CM

## 2022-01-12 DIAGNOSIS — F330 Major depressive disorder, recurrent, mild: Secondary | ICD-10-CM

## 2022-01-12 LAB — URINALYSIS, ROUTINE W REFLEX MICROSCOPIC
Bilirubin, UA: NEGATIVE
Glucose, UA: NEGATIVE
Ketones, UA: NEGATIVE
Leukocytes,UA: NEGATIVE
Nitrite, UA: NEGATIVE
Protein,UA: NEGATIVE
Specific Gravity, UA: 1.015 (ref 1.005–1.030)
Urobilinogen, Ur: 0.2 mg/dL (ref 0.2–1.0)
pH, UA: 7 (ref 5.0–7.5)

## 2022-01-12 LAB — MICROSCOPIC EXAMINATION
RBC, Urine: NONE SEEN /hpf (ref 0–2)
WBC, UA: NONE SEEN /hpf (ref 0–5)

## 2022-01-12 MED ORDER — VENLAFAXINE HCL 37.5 MG PO TABS: ORAL_TABLET | ORAL | 1 refills | Status: DC

## 2022-01-12 NOTE — Assessment & Plan Note (Signed)
Chronic.  Controlled.  Continue with current medication regimen of Effexor 37.5mg BID.  Refill sent today.  Labs ordered today.  Return to clinic in 6 months for reevaluation.  Call sooner if concerns arise.    

## 2022-01-12 NOTE — Assessment & Plan Note (Signed)
Chronic.  Controlled.  Continue with current medication regimen of Effexor 37.5mg  BID.  Refill sent today.  Labs ordered today.  Return to clinic in 6 months for reevaluation.  Call sooner if concerns arise.

## 2022-01-13 LAB — CBC WITH DIFFERENTIAL/PLATELET
Basophils Absolute: 0.1 10*3/uL (ref 0.0–0.2)
Basos: 1 %
EOS (ABSOLUTE): 0.3 10*3/uL (ref 0.0–0.4)
Eos: 4 %
Hematocrit: 42.2 % (ref 34.0–46.6)
Hemoglobin: 13.7 g/dL (ref 11.1–15.9)
Immature Grans (Abs): 0 10*3/uL (ref 0.0–0.1)
Immature Granulocytes: 0 %
Lymphocytes Absolute: 1.6 10*3/uL (ref 0.7–3.1)
Lymphs: 22 %
MCH: 30.4 pg (ref 26.6–33.0)
MCHC: 32.5 g/dL (ref 31.5–35.7)
MCV: 94 fL (ref 79–97)
Monocytes Absolute: 0.6 10*3/uL (ref 0.1–0.9)
Monocytes: 8 %
Neutrophils Absolute: 4.7 10*3/uL (ref 1.4–7.0)
Neutrophils: 65 %
Platelets: 259 10*3/uL (ref 150–450)
RBC: 4.51 x10E6/uL (ref 3.77–5.28)
RDW: 13.2 % (ref 11.7–15.4)
WBC: 7.3 10*3/uL (ref 3.4–10.8)

## 2022-01-13 LAB — COMPREHENSIVE METABOLIC PANEL
ALT: 19 IU/L (ref 0–32)
AST: 12 IU/L (ref 0–40)
Albumin/Globulin Ratio: 2.4 — ABNORMAL HIGH (ref 1.2–2.2)
Albumin: 4.5 g/dL (ref 3.8–4.9)
Alkaline Phosphatase: 39 IU/L — ABNORMAL LOW (ref 44–121)
BUN/Creatinine Ratio: 29 — ABNORMAL HIGH (ref 9–23)
BUN: 26 mg/dL — ABNORMAL HIGH (ref 6–24)
Bilirubin Total: 0.3 mg/dL (ref 0.0–1.2)
CO2: 22 mmol/L (ref 20–29)
Calcium: 9.8 mg/dL (ref 8.7–10.2)
Chloride: 104 mmol/L (ref 96–106)
Creatinine, Ser: 0.9 mg/dL (ref 0.57–1.00)
Globulin, Total: 1.9 g/dL (ref 1.5–4.5)
Glucose: 78 mg/dL (ref 70–99)
Potassium: 4.3 mmol/L (ref 3.5–5.2)
Sodium: 140 mmol/L (ref 134–144)
Total Protein: 6.4 g/dL (ref 6.0–8.5)
eGFR: 77 mL/min/{1.73_m2} (ref >59)

## 2022-01-13 LAB — LIPID PANEL
Chol/HDL Ratio: 4.5 ratio — ABNORMAL HIGH (ref 0.0–4.4)
Cholesterol, Total: 226 mg/dL — ABNORMAL HIGH (ref 100–199)
HDL: 50 mg/dL (ref >39)
LDL Chol Calc (NIH): 148 mg/dL — ABNORMAL HIGH (ref 0–99)
Triglycerides: 154 mg/dL — ABNORMAL HIGH (ref 0–149)
VLDL Cholesterol Cal: 28 mg/dL (ref 5–40)

## 2022-01-13 LAB — TSH: TSH: 1.65 u[IU]/mL (ref 0.450–4.500)

## 2022-01-13 NOTE — Progress Notes (Signed)
HI Ruth Hammond. It was nice to see you yesterday.  Your lab work looks good.  Your cholesterol is elevated form prior.  I recommend a low fat diet and exercise.  Your cardiac risk score is still in the low range.  No need for medications at this time. Continue with your current medication regimen.  Follow up as discussed.  Please let me know if you have any questions.    The 10-year ASCVD risk score (Arnett DK, et al., 2019) is: 1.4%   Values used to calculate the score:     Age: 51 years     Sex: Female     Is Non-Hispanic African American: No     Diabetic: No     Tobacco smoker: No     Systolic Blood Pressure: 110 mmHg     Is BP treated: No     HDL Cholesterol: 50 mg/dL     Total Cholesterol: 226 mg/dL

## 2022-01-16 ENCOUNTER — Encounter: Payer: Self-pay | Admitting: Nurse Practitioner

## 2022-01-26 ENCOUNTER — Other Ambulatory Visit: Payer: Self-pay | Admitting: Family Medicine

## 2022-01-26 NOTE — Telephone Encounter (Signed)
Requested Prescriptions  Pending Prescriptions Disp Refills  . rizatriptan (MAXALT) 10 MG tablet [Pharmacy Med Name: RIZATRIPTAN 10MG  TABLETS] 20 tablet 0    Sig: TAKE 1 TABLET BY MOUTH AS DIRECTED AS NEEDED FOR MIGRAINE MAY REPEAT IN 2 HOURS IF NEEDED     Neurology:  Migraine Therapy - Triptan Passed - 01/26/2022 10:41 AM      Passed - Last BP in normal range    BP Readings from Last 1 Encounters:  01/12/22 110/81         Passed - Valid encounter within last 12 months    Recent Outpatient Visits          2 weeks ago Annual physical exam   [facility], NP   1 month ago Rhus dermatitis   [facility] [place], [place], NP   5 months ago Migraine without status migrainosus, not intractable, unspecified migraine type   [facility], NP   6 months ago Migraine without status migrainosus, not intractable, unspecified migraine type   [facility] [place], Megan P, DO   1 year ago Annual physical exam   [facility], NP      Future Appointments            In 5 months Larae Grooms, NP [facility], [facility]

## 2022-02-21 ENCOUNTER — Telehealth: Payer: BC Managed Care – PPO | Admitting: Physician Assistant

## 2022-02-21 DIAGNOSIS — L247 Irritant contact dermatitis due to plants, except food: Secondary | ICD-10-CM | POA: Diagnosis not present

## 2022-02-21 MED ORDER — HYDROXYZINE PAMOATE 25 MG PO CAPS: 25.0000 mg | ORAL_CAPSULE | Freq: Three times a day (TID) | ORAL | 0 refills | Status: DC | PRN

## 2022-02-21 MED ORDER — PREDNISONE 10 MG PO TABS: ORAL_TABLET | ORAL | 0 refills | Status: AC

## 2022-02-21 NOTE — Progress Notes (Signed)
Virtual Visit Consent   Ruth Hammond, you are scheduled for a virtual visit with a [facility] provider today. Just as with appointments in the office, your consent must be obtained to participate. Your consent will be active for this visit and any virtual visit you may have with one of our providers in the next 365 days. If you have a MyChart account, a copy of this consent can be sent to you electronically.  As this is a virtual visit, video technology does not allow for your provider to perform a traditional examination. This may limit your provider's ability to fully assess your condition. If your provider identifies any concerns that need to be evaluated in person or the need to arrange testing (such as labs, EKG, etc.), we will make arrangements to do so. Although advances in technology are sophisticated, we cannot ensure that it will always work on either your end or our end. If the connection with a video visit is poor, the visit may have to be switched to a telephone visit. With either a video or telephone visit, we are not always able to ensure that we have a secure connection.  By engaging in this virtual visit, you consent to the provision of healthcare and authorize for your insurance to be billed (if applicable) for the services provided during this visit. Depending on your insurance coverage, you may receive a charge related to this service.  I need to obtain your verbal consent now. Are you willing to proceed with your visit today? Ruth Hammond has provided verbal consent on 02/21/2022 for a virtual visit (video or telephone). Piedad Climes, [place]  Date: 02/21/2022 7:15 PM  Virtual Visit via Video Note   I, Piedad Climes, connected with  Ruth Hammond  ([id number], 02-05-1971) on 02/21/22 at  7:15 PM EDT by a video-enabled telemedicine application and verified that I am speaking with the correct person using two identifiers.  Location: Patient: Virtual Visit Location  Patient: Home Provider: Virtual Visit Location Provider: Home Office   I discussed the limitations of evaluation and management by telemedicine and the availability of in person appointments. The patient expressed understanding and agreed to proceed.    History of Present Illness: Ruth Hammond is a 52 y.o. who identifies as a female who was assigned female at birth, and is being seen today for poison ivy rash starting in past few days -- including face, neck, in the ears and forearms bilaterally. Notes history of severe reaction to poison ivy, often requiring oral steroids. Is trying to get ahead of things this time. Does not using old Diprolene cream topically. Denies fever, chills, weeping from rash. Denies any SOB.   HPI: HPI  Problems:  Patient Active Problem List   Diagnosis Date Noted   Conjunctivitis 06/08/2020   Migraine 08/06/2017   Chronic left-sided headaches 06/15/2015   Left cervical radiculopathy 06/07/2015   GERD (gastroesophageal reflux disease) 01/08/2015   Anxiety    Depression     Allergies:  Allergies  Allergen Reactions   Other Swelling    Bee stings   Medications:  Current Outpatient Medications:    hydrOXYzine (VISTARIL) 25 MG capsule, Take 1 capsule (25 mg total) by mouth every 8 (eight) hours as needed., Disp: 30 capsule, Rfl: 0   predniSONE (DELTASONE) 10 MG tablet, Take 4 tablets (40 mg total) by mouth daily with breakfast for 4 days, THEN 3 tablets (30 mg total) daily with breakfast for 4 days, THEN  2 tablets (20 mg total) daily with breakfast for 3 days, THEN 1 tablet (10 mg total) daily with breakfast for 3 days., Disp: 37 tablet, Rfl: 0   augmented betamethasone dipropionate (DIPROLENE AF) 0.05 % cream, Apply topically 2 (two) times daily., Disp: 30 g, Rfl: 0   Cyanocobalamin (VITAMIN B 12 PO), Take by mouth daily., Disp: , Rfl:    eletriptan (RELPAX) 40 MG tablet, TAKE 1 TABLET BY MOUTH AS NEEDED FOR MIGRAINE OR HEADACHE, MAY REPEAT IN 2 HOURS IF  HEADACHE PERSISTS OR RECURS, Disp: 30 tablet, Rfl: 2   ibuprofen (ADVIL,MOTRIN) 100 MG/5ML suspension, Take 200 mg by mouth daily., Disp: , Rfl:    Multiple Vitamin (MULTIVITAMIN) tablet, Take 1 tablet by mouth daily., Disp: , Rfl:    nitrofurantoin, macrocrystal-monohydrate, (MACROBID) 100 MG capsule, Take one capsule after intercourse as needed for UTI prevention., Disp: 60 capsule, Rfl: 1   promethazine (PHENERGAN) 25 MG tablet, Take 1 tablet (25 mg total) by mouth every 8 (eight) hours as needed for nausea or vomiting., Disp: 40 tablet, Rfl: 1   rizatriptan (MAXALT) 10 MG tablet, TAKE 1 TABLET BY MOUTH AS DIRECTED AS NEEDED FOR MIGRAINE MAY REPEAT IN 2 HOURS IF NEEDED, Disp: 20 tablet, Rfl: 0   topiramate (TOPAMAX) 25 MG tablet, 2 TABLETS twice a day, Disp: 360 tablet, Rfl: 0   venlafaxine (EFFEXOR) 37.5 MG tablet, Take one tab twice daily., Disp: 180 tablet, Rfl: 1  Observations/Objective: Patient is well-developed, well-nourished in no acute distress.  Resting comfortably at home.  Head is normocephalic, atraumatic.  No labored breathing. Speech is clear and coherent with logical content.  Patient is alert and oriented at baseline.  Papulovesicular rash noted of left neck, chin, forehead bilaterally, in the external ear and in linear distribution of her left forearm  Assessment and Plan: 1. Irritant contact dermatitis due to plants, except food - predniSONE (DELTASONE) 10 MG tablet; Take 4 tablets (40 mg total) by mouth daily with breakfast for 4 days, THEN 3 tablets (30 mg total) daily with breakfast for 4 days, THEN 2 tablets (20 mg total) daily with breakfast for 3 days, THEN 1 tablet (10 mg total) daily with breakfast for 3 days.  Dispense: 37 tablet; Refill: 0 - hydrOXYzine (VISTARIL) 25 MG capsule; Take 1 capsule (25 mg total) by mouth every 8 (eight) hours as needed.  Dispense: 30 capsule; Refill: 0  Start prednisone taper. Supportive measures and OTC medications reviewed. Ok to  continue Diprolene but do not use on face. Hydroxyzine per orders.   Follow Up Instructions: I discussed the assessment and treatment plan with the patient. The patient was provided an opportunity to ask questions and all were answered. The patient agreed with the plan and demonstrated an understanding of the instructions.  A copy of instructions were sent to the patient via MyChart unless otherwise noted below.   The patient was advised to call back or seek an in-person evaluation if the symptoms worsen or if the condition fails to improve as anticipated.  Time:  I spent 10 minutes with the patient via telehealth technology discussing the above problems/concerns.    Piedad Climes, PA-C

## 2022-02-21 NOTE — Patient Instructions (Signed)
Ruth Hammond, thank you for joining Leeanne Rio, PA-C for today's virtual visit.  While this provider is not your primary care provider (PCP), if your PCP is located in our provider database this encounter information will be shared with them immediately following your visit.  Consent: (Patient) Ruth Hammond provided verbal consent for this virtual visit at the beginning of the encounter.  Current Medications:  Current Outpatient Medications:    hydrOXYzine (VISTARIL) 25 MG capsule, Take 1 capsule (25 mg total) by mouth every 8 (eight) hours as needed., Disp: 30 capsule, Rfl: 0   predniSONE (DELTASONE) 10 MG tablet, Take 4 tablets (40 mg total) by mouth daily with breakfast for 4 days, THEN 3 tablets (30 mg total) daily with breakfast for 4 days, THEN 2 tablets (20 mg total) daily with breakfast for 3 days, THEN 1 tablet (10 mg total) daily with breakfast for 3 days., Disp: 37 tablet, Rfl: 0   augmented betamethasone dipropionate (DIPROLENE AF) 0.05 % cream, Apply topically 2 (two) times daily., Disp: 30 g, Rfl: 0   Cyanocobalamin (VITAMIN B 12 PO), Take by mouth daily., Disp: , Rfl:    eletriptan (RELPAX) 40 MG tablet, TAKE 1 TABLET BY MOUTH AS NEEDED FOR MIGRAINE OR HEADACHE, MAY REPEAT IN 2 HOURS IF HEADACHE PERSISTS OR RECURS, Disp: 30 tablet, Rfl: 2   ibuprofen (ADVIL,MOTRIN) 100 MG/5ML suspension, Take 200 mg by mouth daily., Disp: , Rfl:    Multiple Vitamin (MULTIVITAMIN) tablet, Take 1 tablet by mouth daily., Disp: , Rfl:    nitrofurantoin, macrocrystal-monohydrate, (MACROBID) 100 MG capsule, Take one capsule after intercourse as needed for UTI prevention., Disp: 60 capsule, Rfl: 1   promethazine (PHENERGAN) 25 MG tablet, Take 1 tablet (25 mg total) by mouth every 8 (eight) hours as needed for nausea or vomiting., Disp: 40 tablet, Rfl: 1   rizatriptan (MAXALT) 10 MG tablet, TAKE 1 TABLET BY MOUTH AS DIRECTED AS NEEDED FOR MIGRAINE MAY REPEAT IN 2 HOURS IF NEEDED, Disp: 20  tablet, Rfl: 0   topiramate (TOPAMAX) 25 MG tablet, 2 TABLETS twice a day, Disp: 360 tablet, Rfl: 0   venlafaxine (EFFEXOR) 37.5 MG tablet, Take one tab twice daily., Disp: 180 tablet, Rfl: 1   Medications ordered in this encounter:  Meds ordered this encounter  Medications   predniSONE (DELTASONE) 10 MG tablet    Sig: Take 4 tablets (40 mg total) by mouth daily with breakfast for 4 days, THEN 3 tablets (30 mg total) daily with breakfast for 4 days, THEN 2 tablets (20 mg total) daily with breakfast for 3 days, THEN 1 tablet (10 mg total) daily with breakfast for 3 days.    Dispense:  37 tablet    Refill:  0    Order Specific Question:   Supervising Provider    Answer:   MILLER, BRIAN [[id number]]   hydrOXYzine (VISTARIL) 25 MG capsule    Sig: Take 1 capsule (25 mg total) by mouth every 8 (eight) hours as needed.    Dispense:  30 capsule    Refill:  0    Order Specific Question:   Supervising Provider    Answer:   Sabra Heck, [place]     *If you need refills on other medications prior to your next appointment, please contact your pharmacy*  Follow-Up: Call back or seek an in-person evaluation if the symptoms worsen or if the condition fails to improve as anticipated.  Other Instructions Poison Ivy Dermatitis Poison ivy dermatitis is redness and  soreness of the skin caused by chemicals in the leaves of the poison ivy plant. You may have very bad itching, swelling, a rash, and blisters. What are the causes? Touching a poison ivy plant. Touching something that has the chemical on it. This may include animals or objects that have come in contact with the plant. What increases the risk? Going outdoors often in wooded or [place] areas. Going outdoors without wearing protective clothing, such as closed shoes, long pants, and a long-sleeved shirt. What are the signs or symptoms?  Skin redness. Very bad itching. A rash that often includes bumps and blisters. The rash usually appears 48 hours  after exposure, if you have been exposed before. If this is the first time you have been exposed, the rash may not appear until a week after exposure. Swelling. This may occur if the reaction is very bad. Symptoms usually last for 1-2 weeks. The first time you develop this condition, symptoms may last 3-4 weeks. How is this treated? This condition may be treated with: Hydrocortisone cream or calamine lotion to relieve itching. Oatmeal baths to soothe the skin. Medicines, such as over-the-counter antihistamine tablets. Oral steroid medicine for more severe reactions. Follow these instructions at home: Medicines Take or apply over-the-counter and prescription medicines only as told by your doctor. Use hydrocortisone cream or calamine lotion as needed to help with itching. General instructions Do not scratch or rub your skin. Put a cold, wet cloth (cold compress) on the affected areas or take baths in cool water. This will help with itching. Avoid hot baths and showers. Take oatmeal baths as needed. Use colloidal oatmeal. You can get this at a pharmacy or grocery store. Follow the instructions on the package. While you have the rash, wash your clothes right after you wear them. Keep all follow-up visits as told by your health care provider. This is important. How is this prevented?  Know what poison ivy looks like, so you can avoid it. This plant has three leaves with flowering branches on a single stem. The leaves are glossy. The leaves have uneven edges that come to a point at the front. If you touch poison ivy, wash your skin with soap and water right away. Be sure to wash under your fingernails. When hiking or camping, wear long pants, a long-sleeved shirt, tall socks, and hiking boots. You can also use a lotion on your skin that helps to prevent contact with poison ivy. If you think that your clothes or outdoor gear came in contact with poison ivy, rinse them off with a garden hose  before you bring them inside your house. When doing yard work or gardening, wear gloves, long sleeves, long pants, and boots. Wash your garden tools and gloves if they come in contact with poison ivy. If you think that your pet has come into contact with poison ivy, wash him or her with pet shampoo and water. Make sure to wear gloves while washing your pet. Contact a doctor if: You have open sores in the rash area. You have more redness, swelling, or pain in the rash area. You have redness that spreads beyond the rash area. You have fluid, blood, or pus coming from the rash area. You have a fever. You have a rash over a large area of your body. You have a rash on your eyes, mouth, or genitals. Your rash does not get better after a few weeks. Get help right away if: Your face swells or your eyes swell  shut. You have trouble breathing. You have trouble swallowing. These symptoms may be an emergency. Do not wait to see if the symptoms will go away. Get medical help right away. Call your local emergency services (911 in the [place] Do not drive yourself to the hospital. Summary Poison ivy dermatitis is redness and soreness of the skin caused by chemicals in the leaves of the poison ivy plant. You may have skin redness, very bad itching, swelling, and a rash. Do not scratch or rub your skin. Take or apply over-the-counter and prescription medicines only as told by your doctor. This information is not intended to replace advice given to you by your health care provider. Make sure you discuss any questions you have with your health care provider. Document Revised: 03/21/2021 Document Reviewed: 03/21/2021 Elsevier Patient Education  2023 Elsevier Inc.    If you have been instructed to have an in-person evaluation today at a local Urgent Care facility, please use the link below. It will take you to a list of all of our available [facility], including address, phone number and hours  of operation. Please do not delay care.  [facility]  If you or a family member do not have a primary care provider, use the link below to schedule a visit and establish care. When you choose a [facility] primary care physician or advanced practice provider, you gain a long-term partner in health. Find a Primary Care Provider  Learn more about [facility] in-office and virtual care options: [facility] - Get Care Now

## 2022-03-13 ENCOUNTER — Other Ambulatory Visit: Payer: Self-pay | Admitting: Nurse Practitioner

## 2022-03-14 NOTE — Telephone Encounter (Signed)
Requested Prescriptions  Pending Prescriptions Disp Refills  . rizatriptan (MAXALT) 10 MG tablet [Pharmacy Med Name: RIZATRIPTAN 10MG  TABLETS] 20 tablet 0    Sig: TAKE 1 TABLET BY MOUTH ONCE WHEN NEEDED FOR MIGRAINE. MAY REPEAT DOSE IN 2 HOURS IF NEEDED AS DIRECTED.     Neurology:  Migraine Therapy - Triptan Passed - 03/13/2022  7:10 PM      Passed - Last BP in normal range    BP Readings from Last 1 Encounters:  01/12/22 110/81         Passed - Valid encounter within last 12 months    Recent Outpatient Visits          2 months ago Annual physical exam   [place], NP   2 months ago Rhus dermatitis   [facility] Kathrine Haddock, NP   6 months ago Migraine without status migrainosus, not intractable, unspecified migraine type   [facility] Jon Billings, NP   7 months ago Migraine without status migrainosus, not intractable, unspecified migraine type   [facility], [place], DO   1 year ago Annual physical exam   [facility] Jon Billings, NP      Future Appointments            In 4 months Jon Billings, NP [facility], [place]

## 2022-04-07 ENCOUNTER — Other Ambulatory Visit: Payer: Self-pay | Admitting: Family Medicine

## 2022-04-07 NOTE — Telephone Encounter (Signed)
Filled 07/21/2021 #40, 1 refill, upcoming OV 07/20/2022. Please advise.

## 2022-04-07 NOTE — Telephone Encounter (Signed)
Requested medication (s) are due for refill today:   Provider to review  Requested medication (s) are on the active medication list:   Yes  Future visit scheduled:   Yes   Last ordered: 07/21/2021 #40, 1 refill  Non delegated refill     Requested Prescriptions  Pending Prescriptions Disp Refills   promethazine (PHENERGAN) 25 MG tablet [Pharmacy Med Name: PROMETHAZINE 25MG  TABLETS] 40 tablet 1    Sig: TAKE 1 TABLET(25 MG) BY MOUTH EVERY 8 HOURS AS NEEDED FOR NAUSEA OR VOMITING     Not Delegated - Gastroenterology: Antiemetics Failed - 04/07/2022  9:39 AM      Failed - This refill cannot be delegated      Passed - Valid encounter within last 6 months    Recent Outpatient Visits           2 months ago Annual physical exam   [place], NP   3 months ago Rhus dermatitis   [facility] [place], Malachy Mood, NP   7 months ago Migraine without status migrainosus, not intractable, unspecified migraine type   [facility] Jon Billings, NP   8 months ago Migraine without status migrainosus, not intractable, unspecified migraine type   [place], [place], DO   1 year ago Annual physical exam   [facility] Jon Billings, NP       Future Appointments             In 3 months Jon Billings, NP [facility], [place]

## 2022-04-10 ENCOUNTER — Encounter: Payer: Self-pay | Admitting: Nurse Practitioner

## 2022-04-17 ENCOUNTER — Ambulatory Visit: Payer: BC Managed Care – PPO | Attending: Physician Assistant | Admitting: Physical Therapy

## 2022-04-17 ENCOUNTER — Encounter: Payer: Self-pay | Admitting: Physical Therapy

## 2022-04-17 DIAGNOSIS — M6281 Muscle weakness (generalized): Secondary | ICD-10-CM | POA: Insufficient documentation

## 2022-04-17 DIAGNOSIS — R278 Other lack of coordination: Secondary | ICD-10-CM | POA: Diagnosis not present

## 2022-04-17 DIAGNOSIS — R29898 Other symptoms and signs involving the musculoskeletal system: Secondary | ICD-10-CM | POA: Insufficient documentation

## 2022-04-17 NOTE — Therapy (Signed)
OUTPATIENT PHYSICAL THERAPY FEMALE PELVIC EVALUATION   Patient Name: Ruth Hammond MRN: [id number] DOB:04/10/1971, 51 y.o., female Today's Date: 04/17/2022   PT End of Session - 04/17/22 1551     Visit Number 1    Number of Visits 12    Date for PT Re-Evaluation 07/10/22    Authorization Type IE 04/17/2022    PT Start Time 1600    PT Stop Time 1640    PT Time Calculation (min) 40 min    Activity Tolerance Patient tolerated treatment well    Behavior During Therapy [facility] for tasks assessed/performed             Past Medical History:  Diagnosis Date   Anxiety    Complication of anesthesia    Depression    PONV (postoperative nausea and vomiting)    Past Surgical History:  Procedure Laterality Date   COLONOSCOPY N/A 01/04/2021   Procedure: COLONOSCOPY;  Surgeon: Wyline Mood, MD;  Location: [facility] ENDOSCOPY;  Service: Gastroenterology;  Laterality: N/A;   DILATION AND CURETTAGE OF UTERUS     neck fusion  10/2015   TUBAL LIGATION     WISDOM TOOTH EXTRACTION     Patient Active Problem List   Diagnosis Date Noted   Conjunctivitis 06/08/2020   Migraine 08/06/2017   Chronic left-sided headaches 06/15/2015   Left cervical radiculopathy 06/07/2015   GERD (gastroesophageal reflux disease) 01/08/2015   Anxiety    Depression     PCP: Larae Grooms, NP  REFERRING PROVIDER: Joycelyn Man, *  REFERRING DIAG: Mixed stress and urge urinary incontinence   Incontinence without sensory awareness   POP-Q stage 1 cystocele   THERAPY DIAG:  Other lack of coordination  Muscle weakness (generalized)  Poor body mechanics  Rationale for Evaluation and Treatment Rehabilitation  PRECAUTIONS: None  WEIGHT BEARING RESTRICTIONS No  FALLS:  Has patient fallen in last 6 months? No  ONSET DATE: 2000  SUBJECTIVE:                                                                                                                                                                                            CHIEF COMPLAINT: Patient notes that since giving birth she noticed that if she did any strenuous activity she would have UI. Patient is a former runner which was discontinued as a result of UI. Patient notes with each subsequent birth, her symptoms worsened. Patient notes UI with normal/casual walking. Patient notes this can occur after having just emptied. Patient reports family history    PERTINENT HISTORY/CHART REVIEW:  Red flags (bowel/bladder changes, saddle paresthesia, personal history of cancer, h/o  spinal tumors, h/o compression fx, h/o abdominal aneurysm, abdominal pain, chills/fever, night sweats, nausea, vomiting, unrelenting pain, first onset of insidious LBP <20 y/o): Negative  "Urinary Symptoms: Stress urinary incontinence: yes.  Urgency urinary incontinence: yes.  Leaks 1-2 time(s) per day(s).  Pad use: Sometimes.  She is bothered by her UI symptoms.  Nocturia: 1 times per night to void. Splinting or shifting to void: no   UTIs: 1 UTI's in the last year.  She denies any history of kidney stones or hematuria.  Pelvic Organ Prolapse Symptoms:  She denies a feeling of a bulge the vaginal area.  She denies seeing a bulge.   Bowel Symptom: Bowel movements: 1-2 time(s) per week(s).  Stool consistency: soft.  She denies accidental bowel leakage / fecal incontinence. Straining: No. Splinting: No. Incomplete evacuation: No. Fecal urgency: No.  MEDICAL HISTORY:  Past OB/GYN History: G5 P3 Vaginal deliveries: 3 Forceps delivery: 0 Cesarean section: 0 Hysterectomy: no She is not menopausal.  She is sexually active.  She does not have dyspareunia. Contraception: Tubal. Last pap smear was 07/2021 - neg. History of abnormal pap smears: no.  Last colonoscopy was 01/2021 - neg."  PAIN:  Are you having pain? 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, enjoys walking dog with husband, likes  to stay active  PLOF:  Independent  PATIENT GOALS: Stop leakage, prevent need for surgery in the future  OBSTETRICAL HISTORY: G5P3 Deliveries: 3 forceps delivery Tearing/Episiotomy: minimal tearing of the perineum  GYNECOLOGICAL HISTORY: Hysterectomy: No Vaginal/Abdominal Endometriosis: Negative Last Menstrual Period:  Pain with exam: No  Prolapse: Cystocele grade 1 Heaviness/pressure: No   UROLOGICAL HISTORY: Frequency of urination: every 2 hours at work, every 1 hour at home Incontinence: Urge to void, Walking to the bathroom, Coughing, Sneezing, Laughing, Exercise, and Intercourse  Onset: 2000 Amount: Min- Mod Protective undergarments: No  ; packs extra underwear Number used/day: 2-3 on average Fluid Intake: ~50 oz H20, 1 cup coffee caffeinated, 1-2 diet pepsi Nocturia: 1x/night Toileting posture: feet flat Incomplete emptying: No  Pain with urination: Negative Stream: Strong and notes new onset of spraying and deviating  Urgency: Yes  Difficulty initiating urination: Negative Intermittent stream: Positive for ~ 10% of voids.  Frequent UTI: Positive.   GASTROINTESTINAL HISTORY: Type of bowel movement: (Bristol Stool Scale) 4 Frequency of BMs: 2-4x/week; childhood history of constipation Incomplete bowel movement: No  Pain with defecation: Negative Straining with defecation: Negative Hemorrhoids: Negative ; internal/external; active/latent Toileting posture: feet flat Fiber supplement: No  Incontinence: Negative.    SEXUAL HISTORY AND FUNCTION: Sexually active: Yes  Pain with penetration: N/A Pain with external stimulation: No  Change in ability to achieve orgasm: No  Sexual abuse: No    OBJECTIVE:  DIAGNOSTIC TESTING/IMAGING: "A PVR of 1 ml was obtained by bladder scan." (03/31/2022)  COGNITION:  Patient is oriented to person, place, and time.  Recent memory is intact.  Remote memory is intact.  Attention span and concentration are intact.   Expressive speech is intact.  Patient's fund of knowledge is within normal limits for educational level.    POSTURE/OBSERVATIONS:   Lumbar lordosis: mildly diminished Thoracic kyphosis: mildly increased Iliac crest height: not formally assessed  Lumbar lateral shift: not formally assessed  Pelvic obliquity: not formally assessed  Leg length discrepancy: not formally assessed    GAIT: Grossly WFL.    RANGE OF MOTION: deferred 2/2 to time constraints  AROM (Normal range in degrees) AROM  04/17/2022  Lumbar   Flexion (65)   Extension (30)   Right lateral flexion (25)   Left lateral flexion (25)   Right rotation (30)   Left rotation (30)       Hip LEFT RIGHT  Flexion (125)    Extension (15)    Abduction (40)    Adduction     Internal Rotation (45)    External Rotation (45)    (* = pain; blank rows = not tested)   SENSATION: deferred 2/2 to time constraints  Grossly intact to light touch bilateral LEs as determined by testing dermatomes L2-S2 Proprioception and hot/cold testing deferred on this date   STRENGTH: MMT deferred 2/2 to time constraints   RLE LLE  Hip Flexion    Hip Extension    Hip Abduction     Hip Adduction     Hip ER     Hip IR     Knee Extension    Knee Flexion    Dorsiflexion     Plantarflexion (seated)    (* = pain; blank rows = not tested)   MUSCLE LENGTH: deferred 2/2 to time constraints  Hamstrings: R: Not done L: Not done Ely (quadriceps): R: Not done L: Not done Thomas (hip flexors): R: Not done L: Not done Ober: R: Not done L: Not done Adductors: R: Not done L: Not done  ABDOMINAL: deferred 2/2 to time constraints  Palpation: Diastasis: Scar mobility: Rib flare:   SPECIAL TESTS: deferred 2/2 to time constraints   PALPATION: deferred 2/2 to time constraints  LOCATION LEFT  RIGHT           Lumbar paraspinals    Quadratus Lumborum    Gluteus Maximus    Gluteus Medius    Deep hip external rotators    PSIS     Fortin's Area (SIJ)    Greater Trochanter    ASIS    Sacral border    Coccyx    Ischial tuberosity    (blank rows = not tested) Graded on 0-4 scale (0 = no pain, 1 = pain, 2 = pain with wincing/grimacing/flinching, 3 = pain with withdrawal, 4 = unwilling to allow palpation)   PHYSICAL PERFORMANCE MEASURES:  STS: WFL Deep Squat: RLE STS: LLE STS:  : 5TSTS:     EXTERNAL PELVIC EXAM: deferred 2/2 to time constraints None given; testing deferred to later date  Breath coordination: Voluntary Contraction: present/absent Relaxation: full/delayed/non-relaxing Perineal movement with sustained IAP increase ("bear down"): descent/no change/elevation/excessive descent Perineal movement with rapid IAP increase ("cough"): elevation/no change/descent Palpation of bulbocavernosus: Palpation of ischiocavernosus: Palpation of pubic symphysis: Palpation of superficial transverse perineal:   INTERNAL VAGINAL EXAM: deferred 2/2 to time constraints None given; testing deferred to later date  Introitus Appears:  Skin integrity:  Scar mobility: Strength (PERF): /5 Laycock MMT,  sec x  reps,  fast twitch Symmetry: Palpation: Prolapse: (0 no contraction, 1 flicker, 2 weak squeeze and no lift, 3 fair squeeze and definite lift, 4 good squeeze and lift against resistance, 5 strong squeeze against strong resistance)    PATIENT EDUCATION:  Patient educated on prognosis, POC, and provided with HEP including: double voiding, bladder diary. Patient articulated understanding and returned demonstration. Patient will benefit from further education in order to maximize compliance and understanding for long-term therapeutic gains.   PATIENT SURVEYS:  FOTO PFDI Prolapse 29, PFDI Urinary 38, Urinary Problem 48  ASSESSMENT:  Clinical Impression: Patient is a 51 year old presenting to clinic with  chief complaints of urinary incontinence. Today's evaluation is suggestive of deficits in PFM  coordination, PFM strength, IAP management, and posture as evidenced by diminished lordosis and increased kyphosis of the spine, urinary incontinence immediately post-void, urinary incontinence with sudden increase in IAP, urinary incontinence with physical activity. Patient's responses on FOTO outcome measures (PFDI Prolapse 29, PFDI Urinary 38, Urinary Problem 48) indicate significant functional limitations/disability/distress. Patient's progress may be limited due to persistence of complaint; however, patient's motivation is advantageous. Patient will benefit from continued skilled therapeutic intervention to address deficits in PFM coordination, PFM strength, IAP management, and posture in order to increase function and improve overall QOL.   Objective impairments: decreased activity tolerance, decreased coordination, decreased endurance, decreased strength, improper body mechanics, and postural dysfunction.   Activity limitations: meal prep, cleaning, laundry, interpersonal relationship, shopping, community activity, occupation, yard work, and physical fitness activities .   Personal factors: Behavior pattern, Past/current experiences, Profession, Time since onset of injury/illness/exacerbation, and 3+ comorbidities: anxiety, migraines, depression, GERD  are also affecting patient's functional outcome.   Rehab Potential: Good  Clinical decision making: Evolving/moderate complexity  Evaluation complexity: Moderate   GOALS: Goals reviewed with patient? Yes  SHORT TERM GOALS: Target date: 05/29/2022  Patient will demonstrate independence with HEP in order to maximize therapeutic gains and improve carryover from physical therapy sessions to ADLs in the home and community. Baseline: not initiated Goal status: INITIAL   LONG TERM GOALS: Target date: 07/10/2022  Patient will demonstrate improved function as evidenced by a score of 59 on FOTO measure for full participation in activities at  home and in the community.  Baseline: 48 Goal status: INITIAL  Patient will demonstrate improved function as evidenced by a score of <12 on PFDI Urinary and Prolapse measures for full participation in activities at home and in the community. Baseline: P 29, U 38 Goal status: INITIAL  Patient will report confidence in ability to control bladder > 7/10 in order to demonstrate improved function and ability to participate more fully in activities at home and in the community.  Baseline: not formally assessed  Goal status: INITIAL  Patient will demonstrate circumferential and sequential contraction of >3/5 MMT, > 5 sec hold x5 and 5 consecutive quick flicks with </= 10 min rest between testing bouts, and relaxation of the PFM coordinated with breath for improved management of intra-abdominal pressure and normal bowel and bladder function without the presence of pain nor incontinence in order to improve participation at home and in the community.  Baseline: not formally assessed  Goal status: INITIAL   PLAN: Rehab frequency: 1x/week  Rehab duration: 12 weeks  Planned interventions: Therapeutic exercises, Therapeutic activity, Neuromuscular re-education, Balance training, Gait training, Patient/Family education, Self Care, Joint mobilization, Orthotic/Fit training, Electrical stimulation, Spinal mobilization, Cryotherapy, Moist heat, Taping, and Manual therapy     Myles Gip PT, DPT [phone]  04/17/2022, 4:06 PM

## 2022-04-20 ENCOUNTER — Ambulatory Visit: Payer: BC Managed Care – PPO | Admitting: Nurse Practitioner

## 2022-04-20 ENCOUNTER — Encounter: Payer: Self-pay | Admitting: Nurse Practitioner

## 2022-04-20 VITALS — BP 114/72 | HR 91 | Temp 97.7°F | Wt 168.3 lb

## 2022-04-20 DIAGNOSIS — E782 Mixed hyperlipidemia: Secondary | ICD-10-CM

## 2022-04-20 DIAGNOSIS — F419 Anxiety disorder, unspecified: Secondary | ICD-10-CM | POA: Diagnosis not present

## 2022-04-20 DIAGNOSIS — R635 Abnormal weight gain: Secondary | ICD-10-CM | POA: Diagnosis not present

## 2022-04-20 DIAGNOSIS — F330 Major depressive disorder, recurrent, mild: Secondary | ICD-10-CM | POA: Diagnosis not present

## 2022-04-20 DIAGNOSIS — Z23 Encounter for immunization: Secondary | ICD-10-CM

## 2022-04-20 NOTE — Assessment & Plan Note (Signed)
Chronic.  Controlled.  Continue with current medication regimen of Effexor 37.5mg.  Labs ordered today.  Return to clinic in 6 months for reevaluation.  Call sooner if concerns arise.   

## 2022-04-20 NOTE — Assessment & Plan Note (Signed)
Chronic.  Controlled without medication..  Labs ordered today.  Return to clinic in 6 months for reevaluation.  Call sooner if concerns arise.  ° °

## 2022-04-20 NOTE — Progress Notes (Signed)
BP 114/72   Pulse 91   Temp 97.7 F (36.5 C) (Oral)   Wt 168 lb 4.8 oz (76.3 kg)   SpO2 97%   BMI 26.42 kg/m    Subjective:    Patient ID: Ruth Hammond, female    DOB: 06/11/1971, 51 y.o.   MRN: [id number]  HPI: Ruth Hammond is a 51 y.o. female  Chief Complaint  Patient presents with   Weight Gain    Patient would like to discuss weight gain and cholesterol.    HYPERLIPIDEMIA Hyperlipidemia status: excellent compliance Satisfied with current treatment?  no Side effects:  no Medication compliance: excellent compliance Past cholesterol meds: none Supplements: none Aspirin:  no The 10-year ASCVD risk score (Arnett DK, et al., 2019) is: 1.5%   Values used to calculate the score:     Age: 14 years     Sex: Female     Is Non-Hispanic African American: No     Diabetic: No     Tobacco smoker: No     Systolic Blood Pressure: 263 mmHg     Is BP treated: No     HDL Cholesterol: 50 mg/dL     Total Cholesterol: 226 mg/dL Chest pain:  no Coronary artery disease:  no Family history CAD:  no Family history early CAD:  no  MIGRAINES Patient states her migraines are well controlled.  She stopped the Topamax.  She feels like she is doing okay with out it.   MOOD She feels like her mood is doing well.  Feels like the Effexor is working well for her.    [place] Office Visit from 04/20/2022 in [facility]  PHQ-9 Total Score 0         04/20/2022    2:30 PM 01/12/2022   11:19 AM 12/27/2021   10:09 AM 07/21/2021    3:37 PM  GAD 7 : Generalized Anxiety Score  Nervous, Anxious, on Edge 0 0 0 0  Control/stop worrying 0 0 0 0  Worry too much - different things 0 0 0 0  Trouble relaxing 0 0 0 0  Restless 0 0 0 0  Easily annoyed or irritable 0 0 0 0  Afraid - awful might happen 0 0 0 0  Total GAD 7 Score 0 0 0 0  Anxiety Difficulty Not difficult at all Not difficult at all Not difficult at all      WEIGHT GAIN Patient stats she has noticed weight gain  that started in the summer. Feels like she wasn't watching what she was eating as closely as she was before.  She was increasing her exercise and was eating one larger meal and smaller meals during the day.    Relevant past medical, surgical, family and social history reviewed and updated as indicated. Interim medical history since our last visit reviewed. Allergies and medications reviewed and updated.  Review of Systems  Constitutional:  Positive for unexpected weight change.  Neurological:  Positive for headaches.  Psychiatric/Behavioral:  Positive for dysphoric mood. Negative for suicidal ideas. The patient is nervous/anxious.     Per HPI unless specifically indicated above     Objective:    BP 114/72   Pulse 91   Temp 97.7 F (36.5 C) (Oral)   Wt 168 lb 4.8 oz (76.3 kg)   SpO2 97%   BMI 26.42 kg/m   Wt Readings from Last 3 Encounters:  04/20/22 168 lb 4.8 oz (76.3 kg)  01/12/22 166 lb 11.2  oz (75.6 kg)  12/27/21 166 lb (75.3 kg)    Physical Exam Vitals and nursing note reviewed.  Constitutional:      General: She is not in acute distress.    Appearance: Normal appearance. She is normal weight. She is not ill-appearing, toxic-appearing or diaphoretic.  HENT:     Head: Normocephalic.     Right Ear: External ear normal.     Left Ear: External ear normal.     Nose: Nose normal.     Mouth/Throat:     Mouth: Mucous membranes are moist.     Pharynx: Oropharynx is clear.  Eyes:     General:        Right eye: No discharge.        Left eye: No discharge.     Extraocular Movements: Extraocular movements intact.     Conjunctiva/sclera: Conjunctivae normal.     Pupils: Pupils are equal, round, and reactive to light.  Cardiovascular:     Rate and Rhythm: Normal rate and regular rhythm.     Heart sounds: No murmur heard. Pulmonary:     Effort: Pulmonary effort is normal. No respiratory distress.     Breath sounds: Normal breath sounds. No wheezing or rales.   Musculoskeletal:     Cervical back: Normal range of motion and neck supple.  Skin:    General: Skin is warm and dry.     Capillary Refill: Capillary refill takes less than 2 seconds.  Neurological:     General: No focal deficit present.     Mental Status: She is alert and oriented to person, place, and time. Mental status is at baseline.  Psychiatric:        Mood and Affect: Mood normal.        Behavior: Behavior normal.        Thought Content: Thought content normal.        Judgment: Judgment normal.     Results for orders placed or performed in visit on 01/12/22  HM MAMMOGRAPHY  Result Value Ref Range   HM Mammogram 0-4 Bi-Rad 0-4 Bi-Rad, Self Reported Normal      Assessment & Plan:   Problem List Items Addressed This Visit       Other   Anxiety    Chronic.  Controlled.  Continue with current medication regimen of Effexor 37.33m.  Labs ordered today.  Return to clinic in 6 months for reevaluation.  Call sooner if concerns arise.       Relevant Orders   Comp Met (CMET)   Depression - Primary    Chronic.  Controlled.  Continue with current medication regimen of Effexor 37.523m  Labs ordered today.  Return to clinic in 6 months for reevaluation.  Call sooner if concerns arise.        Relevant Orders   Comp Met (CMET)   Mixed hyperlipidemia    Chronic.  Controlled without medication.  Labs ordered today.  Return to clinic in 6 months for reevaluation.  Call sooner if concerns arise.        Relevant Orders   Lipid Profile   Other Visit Diagnoses     Weight gain       Will check labs at visit today. Recommend diet and exercise. Can consider injectable GLP1 in the future if needed.   Relevant Orders   Thyroid Panel With TSH   Thyroid peroxidase antibody        Follow up plan: Return in about 3 months (around  07/21/2022) for Weight Managment.

## 2022-04-21 LAB — THYROID PANEL WITH TSH
Free Thyroxine Index: 1.3 (ref 1.2–4.9)
T3 Uptake Ratio: 22 % — ABNORMAL LOW (ref 24–39)
T4, Total: 5.9 ug/dL (ref 4.5–12.0)
TSH: 2.26 u[IU]/mL (ref 0.450–4.500)

## 2022-04-21 LAB — LIPID PANEL
Chol/HDL Ratio: 3.5 ratio (ref 0.0–4.4)
Cholesterol, Total: 197 mg/dL (ref 100–199)
HDL: 57 mg/dL (ref >39)
LDL Chol Calc (NIH): 125 mg/dL — ABNORMAL HIGH (ref 0–99)
Triglycerides: 80 mg/dL (ref 0–149)
VLDL Cholesterol Cal: 15 mg/dL (ref 5–40)

## 2022-04-21 LAB — COMPREHENSIVE METABOLIC PANEL
ALT: 21 IU/L (ref 0–32)
AST: 19 IU/L (ref 0–40)
Albumin/Globulin Ratio: 2.1 (ref 1.2–2.2)
Albumin: 4.5 g/dL (ref 3.8–4.9)
Alkaline Phosphatase: 53 IU/L (ref 44–121)
BUN/Creatinine Ratio: 19 (ref 9–23)
BUN: 15 mg/dL (ref 6–24)
Bilirubin Total: <0.2 mg/dL (ref 0.0–1.2)
CO2: 27 mmol/L (ref 20–29)
Calcium: 10 mg/dL (ref 8.7–10.2)
Chloride: 100 mmol/L (ref 96–106)
Creatinine, Ser: 0.8 mg/dL (ref 0.57–1.00)
Globulin, Total: 2.1 g/dL (ref 1.5–4.5)
Glucose: 79 mg/dL (ref 70–99)
Potassium: 4.1 mmol/L (ref 3.5–5.2)
Sodium: 138 mmol/L (ref 134–144)
Total Protein: 6.6 g/dL (ref 6.0–8.5)
eGFR: 89 mL/min/{1.73_m2} (ref >59)

## 2022-04-21 LAB — THYROID PEROXIDASE ANTIBODY: Thyroperoxidase Ab SerPl-aCnc: <9 IU/mL (ref 0–34)

## 2022-04-21 NOTE — Progress Notes (Signed)
Hi Ruth Hammond. It was good to see you yesterday.  Your lab work looks good. Your cholesterol has improved from prior. Your hard work on diet changes have paid off.  Keep up the good work.  Your thyroid labs are unremarkable and do not require treatment at this time.  Follow up as discussed.

## 2022-04-24 ENCOUNTER — Encounter: Payer: BC Managed Care – PPO | Admitting: Physical Therapy

## 2022-04-25 ENCOUNTER — Ambulatory Visit: Payer: BC Managed Care – PPO | Admitting: Physical Therapy

## 2022-05-01 ENCOUNTER — Encounter: Payer: BC Managed Care – PPO | Admitting: Physical Therapy

## 2022-05-03 ENCOUNTER — Encounter: Payer: BC Managed Care – PPO | Admitting: Physical Therapy

## 2022-05-08 ENCOUNTER — Encounter: Payer: BC Managed Care – PPO | Admitting: Physical Therapy

## 2022-05-08 ENCOUNTER — Other Ambulatory Visit: Payer: Self-pay | Admitting: Nurse Practitioner

## 2022-05-09 NOTE — Telephone Encounter (Signed)
Requested Prescriptions  Pending Prescriptions Disp Refills   rizatriptan (MAXALT) 10 MG tablet [Pharmacy Med Name: RIZATRIPTAN 10MG  TABLETS] 20 tablet 0    Sig: TAKE 1 TABLET BY MOUTH 1 TIME AS NEEDED FOR MIGRAINE. MAY REPEAT DOSE IN 2 HOURS IF NEEDED AS DIRECTED     Neurology:  Migraine Therapy - Triptan Passed - 05/08/2022  1:56 PM      Passed - Last BP in normal range    BP Readings from Last 1 Encounters:  04/20/22 114/72         Passed - Valid encounter within last 12 months    Recent Outpatient Visits           2 weeks ago Mild episode of recurrent major depressive disorder (HCC)   [facility] 13/02/23, NP   3 months ago Annual physical exam   [facility], NP   4 months ago Rhus dermatitis   [facility] [place], NP   8 months ago Migraine without status migrainosus, not intractable, unspecified migraine type   [facility], NP   9 months ago Migraine without status migrainosus, not intractable, unspecified migraine type   [facility], DO       Future Appointments             In 2 months Dorcas Carrow, NP [facility]   In 2 months [facility], NP [facility]

## 2022-05-15 ENCOUNTER — Ambulatory Visit: Payer: BC Managed Care – PPO | Admitting: Physical Therapy

## 2022-05-18 ENCOUNTER — Encounter: Payer: Self-pay | Admitting: Nurse Practitioner

## 2022-05-22 ENCOUNTER — Encounter: Payer: BC Managed Care – PPO | Admitting: Physical Therapy

## 2022-05-23 ENCOUNTER — Ambulatory Visit: Payer: BC Managed Care – PPO | Admitting: Physical Therapy

## 2022-05-25 ENCOUNTER — Telehealth: Payer: BC Managed Care – PPO | Admitting: Family Medicine

## 2022-05-25 DIAGNOSIS — B9689 Other specified bacterial agents as the cause of diseases classified elsewhere: Secondary | ICD-10-CM | POA: Diagnosis not present

## 2022-05-25 DIAGNOSIS — J019 Acute sinusitis, unspecified: Secondary | ICD-10-CM

## 2022-05-25 MED ORDER — AMOXICILLIN-POT CLAVULANATE 875-125 MG PO TABS: 1.0000 | ORAL_TABLET | Freq: Two times a day (BID) | ORAL | 0 refills | Status: AC

## 2022-05-25 MED ORDER — BENZONATATE 100 MG PO CAPS: 100.0000 mg | ORAL_CAPSULE | Freq: Two times a day (BID) | ORAL | 0 refills | Status: DC | PRN

## 2022-05-25 MED ORDER — PREDNISONE 20 MG PO TABS: 40.0000 mg | ORAL_TABLET | Freq: Every day | ORAL | 0 refills | Status: AC

## 2022-05-25 NOTE — Patient Instructions (Signed)
Ruth Hammond, thank you for joining [name], NP for today's virtual visit.  While this provider is not your primary care provider (PCP), if your PCP is located in our provider database this encounter information will be shared with them immediately following your visit.   A [facility] MyChart account gives you access to today's visit and all your visits, tests, and labs performed at [facility] " click here if you don't have a [facility] MyChart account or go to [email or web address]  Consent: (Patient) Ruth Hammond provided verbal consent for this virtual visit at the beginning of the encounter.  Current Medications:  Current Outpatient Medications:    amoxicillin-clavulanate (AUGMENTIN) 875-125 MG tablet, Take 1 tablet by mouth 2 (two) times daily for 7 days., Disp: 14 tablet, Rfl: 0   benzonatate (TESSALON) 100 MG capsule, Take 1 capsule (100 mg total) by mouth 2 (two) times daily as needed for cough., Disp: 20 capsule, Rfl: 0   predniSONE (DELTASONE) 20 MG tablet, Take 2 tablets (40 mg total) by mouth daily with breakfast for 3 days., Disp: 6 tablet, Rfl: 0   augmented betamethasone dipropionate (DIPROLENE AF) 0.05 % cream, Apply topically 2 (two) times daily., Disp: 30 g, Rfl: 0   Cyanocobalamin (VITAMIN B 12 PO), Take by mouth daily., Disp: , Rfl:    eletriptan (RELPAX) 40 MG tablet, TAKE 1 TABLET BY MOUTH AS NEEDED FOR MIGRAINE OR HEADACHE, MAY REPEAT IN 2 HOURS IF HEADACHE PERSISTS OR RECURS, Disp: 30 tablet, Rfl: 2   hydrOXYzine (VISTARIL) 25 MG capsule, Take 1 capsule (25 mg total) by mouth every 8 (eight) hours as needed., Disp: 30 capsule, Rfl: 0   ibuprofen (ADVIL,MOTRIN) 100 MG/5ML suspension, Take 200 mg by mouth daily., Disp: , Rfl:    Multiple Vitamin (MULTIVITAMIN) tablet, Take 1 tablet by mouth daily., Disp: , Rfl:    nitrofurantoin, macrocrystal-monohydrate, (MACROBID) 100 MG capsule, Take one capsule after intercourse as needed for UTI  prevention., Disp: 60 capsule, Rfl: 1   promethazine (PHENERGAN) 25 MG tablet, TAKE 1 TABLET(25 MG) BY MOUTH EVERY 8 HOURS AS NEEDED FOR NAUSEA OR VOMITING, Disp: 40 tablet, Rfl: 1   rizatriptan (MAXALT) 10 MG tablet, TAKE 1 TABLET BY MOUTH 1 TIME AS NEEDED FOR MIGRAINE. MAY REPEAT DOSE IN 2 HOURS IF NEEDED AS DIRECTED, Disp: 20 tablet, Rfl: 0   venlafaxine (EFFEXOR) 37.5 MG tablet, Take one tab twice daily., Disp: 180 tablet, Rfl: 1   Medications ordered in this encounter:  Meds ordered this encounter  Medications   amoxicillin-clavulanate (AUGMENTIN) 875-125 MG tablet    Sig: Take 1 tablet by mouth 2 (two) times daily for 7 days.    Dispense:  14 tablet    Refill:  0    Order Specific Question:   Supervising Provider    Answer:   Merrilee Jansky [[id number]]   benzonatate (TESSALON) 100 MG capsule    Sig: Take 1 capsule (100 mg total) by mouth 2 (two) times daily as needed for cough.    Dispense:  20 capsule    Refill:  0    Order Specific Question:   Supervising Provider    Answer:   Merrilee Jansky [[id number]]   predniSONE (DELTASONE) 20 MG tablet    Sig: Take 2 tablets (40 mg total) by mouth daily with breakfast for 3 days.    Dispense:  6 tablet    Refill:  0    Order Specific Question:   Supervising Provider  AnswerMerrilee Jansky [[id number]]     *If you need refills on other medications prior to your next appointment, please contact your pharmacy*  Follow-Up: Call back or seek an in-person evaluation if the symptoms worsen or if the condition fails to improve as anticipated.  [facility] [phone]  Other Instructions -Take meds as prescribed -Rest -Use a cool mist humidifier especially during the winter months when heat dries out the air. - Use saline nose sprays frequently to help soothe nasal passages and promote drainage. -Saline irrigations of the nose can be very helpful if done frequently.             * 4X daily for 1 week*             *  Use of a nettie pot can be helpful with this.  *Follow directions with this* *Boiled or distilled water only -stay hydrated by drinking plenty of fluids - Keep thermostat turn down low to prevent drying out sinuses - For any cough or congestion- robitussin DM or Delsym as needed - For fever or aches or pains- take tylenol or ibuprofen as directed on bottle             * for fevers greater than 101 orally you may alternate ibuprofen and tylenol every 3 hours.  If you do not improve you will need a follow up visit in person.                  If you have been instructed to have an in-person evaluation today at a local Urgent Care facility, please use the link below. It will take you to a list of all of our available [facility], including address, phone number and hours of operation. Please do not delay care.  [facility]  If you or a family member do not have a primary care provider, use the link below to schedule a visit and establish care. When you choose a [facility] primary care physician or advanced practice provider, you gain a long-term partner in health. Find a Primary Care Provider  Learn more about [facility]'s in-office and virtual care options: [facility] - Get Care Now

## 2022-05-25 NOTE — Progress Notes (Signed)
Virtual Visit Consent   Ruth Hammond, you are scheduled for a virtual visit with a [facility] provider today. Just as with appointments in the office, your consent must be obtained to participate. Your consent will be active for this visit and any virtual visit you may have with one of our providers in the next 365 days. If you have a MyChart account, a copy of this consent can be sent to you electronically.  As this is a virtual visit, video technology does not allow for your provider to perform a traditional examination. This may limit your provider's ability to fully assess your condition. If your provider identifies any concerns that need to be evaluated in person or the need to arrange testing (such as labs, EKG, etc.), we will make arrangements to do so. Although advances in technology are sophisticated, we cannot ensure that it will always work on either your end or our end. If the connection with a video visit is poor, the visit may have to be switched to a telephone visit. With either a video or telephone visit, we are not always able to ensure that we have a secure connection.  By engaging in this virtual visit, you consent to the provision of healthcare and authorize for your insurance to be billed (if applicable) for the services provided during this visit. Depending on your insurance coverage, you may receive a charge related to this service.  I need to obtain your verbal consent now. Are you willing to proceed with your visit today? Ruth Hammond has provided verbal consent on 05/25/2022 for a virtual visit (video or telephone). [name], NP  Date: 05/25/2022 2:45 PM  Virtual Visit via Video Note   I, [name], connected with  Ruth Hammond  ([id number], 1970-10-01) on 05/25/22 at  2:45 PM EST by a video-enabled telemedicine application and verified that I am speaking with the correct person using two identifiers.  Location: Patient: Virtual Visit Location Patient:  Home Provider: Virtual Visit Location Provider: Home Office   I discussed the limitations of evaluation and management by telemedicine and the availability of in person appointments. The patient expressed understanding and agreed to proceed.    History of Present Illness: Ruth Hammond is a 51 y.o. who identifies as a female who was assigned female at birth, and is being seen today for sinus symptoms that started on 05/13/22. Has been treating with OTC medications- Claritin and ibuprofen. Congestion has not gone away- clear drainage to a more tan yellow. Denies fevers, chills, chest pain, shortness of breath. COVID test was negative   Problems:  Patient Active Problem List   Diagnosis Date Noted   Mixed hyperlipidemia 04/20/2022   Conjunctivitis 06/08/2020   Migraine 08/06/2017   Chronic left-sided headaches 06/15/2015   Left cervical radiculopathy 06/07/2015   GERD (gastroesophageal reflux disease) 01/08/2015   Anxiety    Depression     Allergies:  Allergies  Allergen Reactions   Other Swelling    Bee stings   Medications:  Current Outpatient Medications:    augmented betamethasone dipropionate (DIPROLENE AF) 0.05 % cream, Apply topically 2 (two) times daily., Disp: 30 g, Rfl: 0   Cyanocobalamin (VITAMIN B 12 PO), Take by mouth daily., Disp: , Rfl:    eletriptan (RELPAX) 40 MG tablet, TAKE 1 TABLET BY MOUTH AS NEEDED FOR MIGRAINE OR HEADACHE, MAY REPEAT IN 2 HOURS IF HEADACHE PERSISTS OR RECURS, Disp: 30 tablet, Rfl: 2   hydrOXYzine (VISTARIL) 25  MG capsule, Take 1 capsule (25 mg total) by mouth every 8 (eight) hours as needed., Disp: 30 capsule, Rfl: 0   ibuprofen (ADVIL,MOTRIN) 100 MG/5ML suspension, Take 200 mg by mouth daily., Disp: , Rfl:    Multiple Vitamin (MULTIVITAMIN) tablet, Take 1 tablet by mouth daily., Disp: , Rfl:    nitrofurantoin, macrocrystal-monohydrate, (MACROBID) 100 MG capsule, Take one capsule after intercourse as needed for UTI prevention., Disp: 60  capsule, Rfl: 1   promethazine (PHENERGAN) 25 MG tablet, TAKE 1 TABLET(25 MG) BY MOUTH EVERY 8 HOURS AS NEEDED FOR NAUSEA OR VOMITING, Disp: 40 tablet, Rfl: 1   rizatriptan (MAXALT) 10 MG tablet, TAKE 1 TABLET BY MOUTH 1 TIME AS NEEDED FOR MIGRAINE. MAY REPEAT DOSE IN 2 HOURS IF NEEDED AS DIRECTED, Disp: 20 tablet, Rfl: 0   venlafaxine (EFFEXOR) 37.5 MG tablet, Take one tab twice daily., Disp: 180 tablet, Rfl: 1  Observations/Objective: Patient is well-developed, well-nourished in no acute distress.  Resting comfortably  at home.  Head is normocephalic, atraumatic.  No labored breathing.  Speech is clear and coherent with logical content.  Patient is alert and oriented at baseline.    Assessment and Plan:   1. Acute bacterial sinusitis  - amoxicillin-clavulanate (AUGMENTIN) 875-125 MG tablet; Take 1 tablet by mouth 2 (two) times daily for 7 days.  Dispense: 14 tablet; Refill: 0 - benzonatate (TESSALON) 100 MG capsule; Take 1 capsule (100 mg total) by mouth 2 (two) times daily as needed for cough.  Dispense: 20 capsule; Refill: 0 - predniSONE (DELTASONE) 20 MG tablet; Take 2 tablets (40 mg total) by mouth daily with breakfast for 3 days.  Dispense: 6 tablet; Refill: 0   -rest -hydrate  -Take meds as prescribed -Rest -Use a cool mist humidifier especially during the winter months when heat dries out the air. - Use saline nose sprays frequently to help soothe nasal passages and promote drainage. -Saline irrigations of the nose can be very helpful if done frequently.             * 4X daily for 1 week*             * Use of a nettie pot can be helpful with this.  *Follow directions with this* *Boiled or distilled water only -stay hydrated by drinking plenty of fluids - Keep thermostat turn down low to prevent drying out sinuses - For any cough or congestion- robitussin DM or Delsym as needed - For fever or aches or pains- take tylenol or ibuprofen as directed on bottle             *  for fevers greater than 101 orally you may alternate ibuprofen and tylenol every 3 hours.  If you do not improve you will need a follow up visit in person.                 Reviewed side effects, risks and benefits of medication.    Patient acknowledged agreement and understanding of the plan.   Past Medical, Surgical, Social History, Allergies, and Medications have been Reviewed.     Follow Up Instructions: I discussed the assessment and treatment plan with the patient. The patient was provided an opportunity to ask questions and all were answered. The patient agreed with the plan and demonstrated an understanding of the instructions.  A copy of instructions were sent to the patient via MyChart unless otherwise noted below.    The patient was advised to call back or seek an  in-person evaluation if the symptoms worsen or if the condition fails to improve as anticipated.  Time:  I spent 10 minutes with the patient via telehealth technology discussing the above problems/concerns.    Perlie Mayo, NP

## 2022-05-29 ENCOUNTER — Encounter: Payer: BC Managed Care – PPO | Admitting: Physical Therapy

## 2022-05-30 ENCOUNTER — Encounter: Payer: BC Managed Care – PPO | Admitting: Physical Therapy

## 2022-06-05 ENCOUNTER — Encounter: Payer: BC Managed Care – PPO | Admitting: Physical Therapy

## 2022-07-19 ENCOUNTER — Ambulatory Visit: Payer: BC Managed Care – PPO | Admitting: Nurse Practitioner

## 2022-07-20 ENCOUNTER — Ambulatory Visit: Payer: BC Managed Care – PPO | Admitting: Nurse Practitioner

## 2022-07-27 ENCOUNTER — Other Ambulatory Visit: Payer: Self-pay | Admitting: Nurse Practitioner

## 2022-07-27 NOTE — Telephone Encounter (Signed)
Requested Prescriptions  Pending Prescriptions Disp Refills   rizatriptan (MAXALT) 10 MG tablet [Pharmacy Med Name: RIZATRIPTAN 10MG  TABLETS] 20 tablet 0    Sig: TAKE 1 TABLET BY MOUTH 1 TIME AS NEEDED FOR MIGRAINE. MAY REPEAT DOSE IN 2 HOURS AS NEEDED AS DIRECTED     Neurology:  Migraine Therapy - Triptan Passed - 07/27/2022  8:45 AM      Passed - Last BP in normal range    BP Readings from Last 1 Encounters:  04/20/22 114/72         Passed - Valid encounter within last 12 months    Recent Outpatient Visits           3 months ago Mild episode of recurrent major depressive disorder [facility])   [place] Jon Billings, NP   6 months ago Annual physical exam   [place] Jon Billings, NP   7 months ago Rhus dermatitis   [place] Kathrine Haddock, NP   11 months ago Migraine without status migrainosus, not intractable, unspecified migraine type   [place], Karen, NP   1 year ago Migraine without status migrainosus, not intractable, unspecified migraine type   [place], [place], DO

## 2022-07-28 ENCOUNTER — Other Ambulatory Visit: Payer: Self-pay | Admitting: Nurse Practitioner

## 2022-07-28 NOTE — Telephone Encounter (Signed)
Unable to refill per protocol, Rx request is too soon. Last refill 07/27/22 for 20 days.  Requested Prescriptions  Pending Prescriptions Disp Refills   rizatriptan (MAXALT) 10 MG tablet [Pharmacy Med Name: RIZATRIPTAN 10MG TABLETS] 20 tablet 0    Sig: TAKE 1 TABLET BY MOUTH AS DIRECTED AS NEEDED FOR MIGRAINE, MAY REPEAT IN 2 HOURS IF NEEDED     Neurology:  Migraine Therapy - Triptan Passed - 07/28/2022  3:21 AM      Passed - Last BP in normal range    BP Readings from Last 1 Encounters:  04/20/22 114/72         Passed - Valid encounter within last 12 months    Recent Outpatient Visits           3 months ago Mild episode of recurrent major depressive disorder [facility])   [place] Jon Billings, NP   6 months ago Annual physical exam   [place] Jon Billings, NP   7 months ago Rhus dermatitis   [place] Kathrine Haddock, NP   11 months ago Migraine without status migrainosus, not intractable, unspecified migraine type   [place] Jon Billings, NP   1 year ago Migraine without status migrainosus, not intractable, unspecified migraine type   [place], [place], DO

## 2022-07-31 ENCOUNTER — Encounter: Payer: Self-pay | Admitting: Nurse Practitioner

## 2022-08-08 ENCOUNTER — Other Ambulatory Visit: Payer: Self-pay | Admitting: Nurse Practitioner

## 2022-08-09 NOTE — Telephone Encounter (Signed)
Requested Prescriptions  Pending Prescriptions Disp Refills   eletriptan (RELPAX) 40 MG tablet [Pharmacy Med Name: ELETRIPTAN 40MG TABLETS] 30 tablet 2    Sig: TAKE 1 TABLET BY MOUTH AS NEEDED FOR MIGRAINE OR HEADACHE. MAY REPEAT IN 2 HOURS IF HEADACHE PERSISTS OR RECURS     Neurology:  Migraine Therapy - Triptan Passed - 08/08/2022  9:25 AM      Passed - Last BP in normal range    BP Readings from Last 1 Encounters:  04/20/22 114/72         Passed - Valid encounter within last 12 months    Recent Outpatient Visits           3 months ago Mild episode of recurrent major depressive disorder [facility])   [place] Jon Billings, NP   6 months ago Annual physical exam   [place] Jon Billings, NP   7 months ago Rhus dermatitis   [place] Kathrine Haddock, NP   11 months ago Migraine without status migrainosus, not intractable, unspecified migraine type   [place], Karen, NP   1 year ago Migraine without status migrainosus, not intractable, unspecified migraine type   [place], [place], DO

## 2022-08-11 ENCOUNTER — Encounter: Payer: Self-pay | Admitting: Nurse Practitioner

## 2022-09-18 LAB — HM PAP SMEAR: HPV, high-risk: NEGATIVE

## 2022-09-18 LAB — RESULTS CONSOLE HPV: CHL HPV: NEGATIVE

## 2022-09-18 LAB — HM MAMMOGRAPHY

## 2022-09-22 ENCOUNTER — Other Ambulatory Visit: Payer: Self-pay | Admitting: Nurse Practitioner

## 2022-09-22 NOTE — Telephone Encounter (Signed)
Requested medications are due for refill today.  Yes   Requested medications are on the active medications list.  yes  Last refill. 04/07/2022 #40 1 rf  Future visit scheduled.   no  Notes to clinic.  Refill not delegated.    Requested Prescriptions  Pending Prescriptions Disp Refills   promethazine (PHENERGAN) 25 MG tablet [Pharmacy Med Name: PROMETHAZINE 25MG  TABLETS] 40 tablet 1    Sig: TAKE 1 TABLET(25 MG) BY MOUTH EVERY 8 HOURS AS NEEDED FOR NAUSEA OR VOMITING     Not Delegated - Gastroenterology: Antiemetics Failed - 09/22/2022  9:01 AM      Failed - This refill cannot be delegated      Passed - Valid encounter within last 6 months    Recent Outpatient Visits           5 months ago Mild episode of recurrent major depressive disorder (HCC)   [place] [facility] Larae Grooms, NP   8 months ago Annual physical exam   [place] [facility] Larae Grooms, NP   8 months ago Rhus dermatitis   [place] [facility] Gabriel Cirri, NP   1 year ago Migraine without status migrainosus, not intractable, unspecified migraine type   [place] [facility] Larae Grooms, NP   1 year ago Migraine without status migrainosus, not intractable, unspecified migraine type   [place] [facility], Megan P, DO              Signed Prescriptions Disp Refills   venlafaxine (EFFEXOR) 37.5 MG tablet 180 tablet 0    Sig: TAKE 1 TABLET BY MOUTH TWICE DAILY     Psychiatry: Antidepressants - SNRI - desvenlafaxine & venlafaxine Failed - 09/22/2022  9:01 AM      Failed - Lipid Panel in normal range within the last 12 months    Cholesterol, Total  Date Value Ref Range Status  04/20/2022 197 100 - 199 mg/dL Final   LDL Chol Calc (NIH)  Date Value Ref Range Status  04/20/2022 125 (H) 0 - 99 mg/dL Final   HDL  Date Value Ref Range Status  04/20/2022 57 >39 mg/dL Final   Triglycerides  Date Value  Ref Range Status  04/20/2022 80 0 - 149 mg/dL Final         Passed - Cr in normal range and within 360 days    Creatinine, Ser  Date Value Ref Range Status  04/20/2022 0.80 0.57 - 1.00 mg/dL Final         Passed - Completed PHQ-2 or PHQ-9 in the last 360 days      Passed - Last BP in normal range    BP Readings from Last 1 Encounters:  04/20/22 114/72         Passed - Valid encounter within last 6 months    Recent Outpatient Visits           5 months ago Mild episode of recurrent major depressive disorder [facility])   [place] [facility] Larae Grooms, NP   8 months ago Annual physical exam   [place] [facility] Larae Grooms, NP   8 months ago Rhus dermatitis   [facility] [place], Elnita Maxwell, NP   1 year ago Migraine without status migrainosus, not intractable, unspecified migraine type   [facility] Larae Grooms, NP   1 year ago Migraine without status migrainosus, not intractable, unspecified migraine  type   [place] [facility] [place], [place], [place]

## 2022-09-22 NOTE — Telephone Encounter (Signed)
Patient will need an office visit for further refills. Requested Prescriptions  Pending Prescriptions Disp Refills   venlafaxine (EFFEXOR) 37.5 MG tablet [Pharmacy Med Name: VENLAFAXINE 37.5MG  TABLETS] 180 tablet 0    Sig: TAKE 1 TABLET BY MOUTH TWICE DAILY     Psychiatry: Antidepressants - SNRI - desvenlafaxine & venlafaxine Failed - 09/22/2022  9:01 AM      Failed - Lipid Panel in normal range within the last 12 months    Cholesterol, Total  Date Value Ref Range Status  04/20/2022 197 100 - 199 mg/dL Final   LDL Chol Calc (NIH)  Date Value Ref Range Status  04/20/2022 125 (H) 0 - 99 mg/dL Final   HDL  Date Value Ref Range Status  04/20/2022 57 >39 mg/dL Final   Triglycerides  Date Value Ref Range Status  04/20/2022 80 0 - 149 mg/dL Final         Passed - Cr in normal range and within 360 days    Creatinine, Ser  Date Value Ref Range Status  04/20/2022 0.80 0.57 - 1.00 mg/dL Final         Passed - Completed PHQ-2 or PHQ-9 in the last 360 days      Passed - Last BP in normal range    BP Readings from Last 1 Encounters:  04/20/22 114/72         Passed - Valid encounter within last 6 months    Recent Outpatient Visits           5 months ago Mild episode of recurrent major depressive disorder (HCC)   [place] [facility] Larae Grooms, NP   8 months ago Annual physical exam   [facility] Larae Grooms, NP   8 months ago Rhus dermatitis   [place] [facility] [place], Elnita Maxwell, NP   1 year ago Migraine without status migrainosus, not intractable, unspecified migraine type   [place] [facility] Larae Grooms, NP   1 year ago Migraine without status migrainosus, not intractable, unspecified migraine type   [place] [facility], Megan P, DO               promethazine (PHENERGAN) 25 MG tablet [Pharmacy Med Name: PROMETHAZINE 25MG  TABLETS] 40 tablet 1    Sig:  TAKE 1 TABLET(25 MG) BY MOUTH EVERY 8 HOURS AS NEEDED FOR NAUSEA OR VOMITING     Not Delegated - Gastroenterology: Antiemetics Failed - 09/22/2022  9:01 AM      Failed - This refill cannot be delegated      Passed - Valid encounter within last 6 months    Recent Outpatient Visits           5 months ago Mild episode of recurrent major depressive disorder [facility])   [place] [facility] Larae Grooms, NP   8 months ago Annual physical exam   [place] [facility] Larae Grooms, NP   8 months ago Rhus dermatitis   [place] [facility] [place], Elnita Maxwell, NP   1 year ago Migraine without status migrainosus, not intractable, unspecified migraine type   [place] [facility] Larae Grooms, NP   1 year ago Migraine without status migrainosus, not intractable, unspecified migraine type   [place] [facility], Megan P, DO

## 2022-10-19 ENCOUNTER — Encounter: Payer: Self-pay | Admitting: Nurse Practitioner

## 2022-10-20 ENCOUNTER — Other Ambulatory Visit: Payer: Self-pay | Admitting: Nurse Practitioner

## 2022-10-20 NOTE — Telephone Encounter (Signed)
Requested medication (s) are due for refill today- yes  Requested medication (s) are on the active medication list -yes  Future visit scheduled -no  Last refill: 07/27/22 #20  Notes to clinic: Patient has warning- 2 migraine medications listed: Relpax and Maxalt- sent for review   Requested Prescriptions  Pending Prescriptions Disp Refills   rizatriptan (MAXALT) 10 MG tablet [Pharmacy Med Name: RIZATRIPTAN 10MG  TABLETS] 20 tablet 0    Sig: TAKE 1 TABLET BY MOUTH 1 TIME AS NEEDED FOR MIGRAINE. MAY REPEAT DOSE IN 2 HOURS AS NEEDED AS DIRECTED     Neurology:  Migraine Therapy - Triptan Passed - 10/20/2022  9:34 AM      Passed - Last BP in normal range    BP Readings from Last 1 Encounters:  04/20/22 114/72         Passed - Valid encounter within last 12 months    Recent Outpatient Visits           6 months ago Mild episode of recurrent major depressive disorder (HCC)   [facility]   9 months ago Annual physical exam   [place] [facility] Larae Grooms, NP   9 months ago Rhus dermatitis   [place] [facility] [place], Elnita Maxwell, NP   1 year ago Migraine without status migrainosus, not intractable, unspecified migraine type   [place] [facility] Larae Grooms, NP   1 year ago Migraine without status migrainosus, not intractable, unspecified migraine type   [place] [facility] [place], Megan P, DO                 Requested Prescriptions  Pending Prescriptions Disp Refills   rizatriptan (MAXALT) 10 MG tablet [Pharmacy Med Name: RIZATRIPTAN 10MG  TABLETS] 20 tablet 0    Sig: TAKE 1 TABLET BY MOUTH 1 TIME AS NEEDED FOR MIGRAINE. MAY REPEAT DOSE IN 2 HOURS AS NEEDED AS DIRECTED     Neurology:  Migraine Therapy - Triptan Passed - 10/20/2022  9:34 AM      Passed - Last BP in normal range    BP Readings from Last 1 Encounters:  04/20/22 114/72         Passed -  Valid encounter within last 12 months    Recent Outpatient Visits           6 months ago Mild episode of recurrent major depressive disorder [facility])   [place] [facility] Larae Grooms, NP   9 months ago Annual physical exam   [place] [facility] Larae Grooms, NP   9 months ago Rhus dermatitis    [facility] [place], Elnita Maxwell, NP   1 year ago Migraine without status migrainosus, not intractable, unspecified migraine type    [facility], NP   1 year ago Migraine without status migrainosus, not intractable, unspecified migraine type    [facility], Megan P, DO

## 2022-10-20 NOTE — Telephone Encounter (Signed)
"  Patient has warning- 2 migraine medications listed: Relpax and Maxalt- sent for review"

## 2022-10-31 ENCOUNTER — Encounter: Payer: Self-pay | Admitting: Nurse Practitioner

## 2022-10-31 ENCOUNTER — Ambulatory Visit: Payer: BC Managed Care – PPO | Admitting: Nurse Practitioner

## 2022-10-31 VITALS — BP 113/78 | HR 81 | Temp 97.7°F | Ht 66.0 in | Wt 171.3 lb

## 2022-10-31 DIAGNOSIS — L659 Nonscarring hair loss, unspecified: Secondary | ICD-10-CM

## 2022-10-31 NOTE — Progress Notes (Signed)
BP 113/78   Pulse 81   Temp 97.7 F (36.5 C) (Oral)   Ht 5\' 6"  (1.676 m)   Wt 171 lb 4.8 oz (77.7 kg)   SpO2 96%   BMI 27.65 kg/m    Subjective:    Patient ID: Ruth Hammond, female    DOB: [birth year], 52 y.o.   MRN: [id number]  HPI: Ruth Hammond is a 52 y.o. female  Chief Complaint  Patient presents with   Hair Loss     Patient says back in Summer of last year, when she would wash her hair, she noticed big clumps of her hair in her hands. Patient says she tried hair supplements, as she was recommended by her hairstylist. Patient says some of the hair did come back.     Patient says back in Summer of last year, when she would wash her hair, she noticed big clumps of her hair in her hands. Patient says she tried hair supplements, as she was recommended by her hairstylist. Patient says some of the hair did come back.  She is already taking a biotin and keratin mix.  She was also doing collagen but ran out and hasn't been taking it in about a month.       Relevant past medical, surgical, family and social history reviewed and updated as indicated. Interim medical history since our last visit reviewed. Allergies and medications reviewed and updated.  Review of Systems  Endocrine:       Hair loss    Per HPI unless specifically indicated above     Objective:    BP 113/78   Pulse 81   Temp 97.7 F (36.5 C) (Oral)   Ht 5\' 6"  (1.676 m)   Wt 171 lb 4.8 oz (77.7 kg)   SpO2 96%   BMI 27.65 kg/m   Wt Readings from Last 3 Encounters:  10/31/22 171 lb 4.8 oz (77.7 kg)  04/20/22 168 lb 4.8 oz (76.3 kg)  01/12/22 166 lb 11.2 oz (75.6 kg)    Physical Exam Vitals and nursing note reviewed.  Constitutional:      General: She is not in acute distress.    Appearance: Normal appearance. She is normal weight. She is not ill-appearing, toxic-appearing or diaphoretic.  HENT:     Head: Normocephalic.     Right Ear: External ear normal.     Left Ear: External ear normal.      Nose: Nose normal.     Mouth/Throat:     Mouth: Mucous membranes are moist.     Pharynx: Oropharynx is clear.  Eyes:     General:        Right eye: No discharge.        Left eye: No discharge.     Extraocular Movements: Extraocular movements intact.     Conjunctiva/sclera: Conjunctivae normal.     Pupils: Pupils are equal, round, and reactive to light.  Cardiovascular:     Rate and Rhythm: Normal rate and regular rhythm.     Heart sounds: No murmur heard. Pulmonary:     Effort: Pulmonary effort is normal. No respiratory distress.     Breath sounds: Normal breath sounds. No wheezing or rales.  Musculoskeletal:     Cervical back: Normal range of motion and neck supple.  Skin:    General: Skin is warm and dry.     Capillary Refill: Capillary refill takes less than 2 seconds.  Neurological:     General: No focal  deficit present.     Mental Status: She is alert and oriented to person, place, and time. Mental status is at baseline.  Psychiatric:        Mood and Affect: Mood normal.        Behavior: Behavior normal.        Thought Content: Thought content normal.        Judgment: Judgment normal.     Results for orders placed or performed in visit on 04/20/22  Comp Met (CMET)  Result Value Ref Range   Glucose 79 70 - 99 mg/dL   BUN 15 6 - 24 mg/dL   Creatinine, Ser 4.09 0.57 - 1.00 mg/dL   eGFR 89 >81 XB/JYN/8.29   BUN/Creatinine Ratio 19 9 - 23   Sodium 138 134 - 144 mmol/L   Potassium 4.1 3.5 - 5.2 mmol/L   Chloride 100 96 - 106 mmol/L   CO2 27 20 - 29 mmol/L   Calcium 10.0 8.7 - 10.2 mg/dL   Total Protein 6.6 6.0 - 8.5 g/dL   Albumin 4.5 3.8 - 4.9 g/dL   Globulin, Total 2.1 1.5 - 4.5 g/dL   Albumin/Globulin Ratio 2.1 1.2 - 2.2   Bilirubin Total <0.2 0.0 - 1.2 mg/dL   Alkaline Phosphatase 53 44 - 121 IU/L   AST 19 0 - 40 IU/L   ALT 21 0 - 32 IU/L  Lipid Profile  Result Value Ref Range   Cholesterol, Total 197 100 - 199 mg/dL   Triglycerides 80 0 - 149 mg/dL   HDL  57 >56 mg/dL   VLDL Cholesterol Cal 15 5 - 40 mg/dL   LDL Chol Calc (NIH) 213 (H) 0 - 99 mg/dL   Chol/HDL Ratio 3.5 0.0 - 4.4 ratio  Thyroid Panel With TSH  Result Value Ref Range   TSH 2.260 0.450 - 4.500 uIU/mL   T4, Total 5.9 4.5 - 12.0 ug/dL   T3 Uptake Ratio 22 (L) 24 - 39 %   Free Thyroxine Index 1.3 1.2 - 4.9  Thyroid peroxidase antibody  Result Value Ref Range   Thyroperoxidase Ab SerPl-aCnc <9 0 - 34 IU/mL      Assessment & Plan:   Problem List Items Addressed This Visit   None Visit Diagnoses     Hair loss    -  Primary   Will check labs at visit today.  WIll make recommendations based on lab results.   Relevant Orders   Vitamin D (25 hydroxy)   Thyroid Panel With TSH   Thyroid peroxidase antibody   B12        Follow up plan: No follow-ups on file.

## 2022-11-01 LAB — VITAMIN B12: Vitamin B-12: 747 pg/mL (ref 232–1245)

## 2022-11-01 LAB — VITAMIN D 25 HYDROXY (VIT D DEFICIENCY, FRACTURES): Vit D, 25-Hydroxy: 53.2 ng/mL (ref 30.0–100.0)

## 2022-11-01 LAB — THYROID PANEL WITH TSH
Free Thyroxine Index: 1.5 (ref 1.2–4.9)
T3 Uptake Ratio: 24 % (ref 24–39)
T4, Total: 6.4 ug/dL (ref 4.5–12.0)
TSH: 1.97 u[IU]/mL (ref 0.450–4.500)

## 2022-11-01 LAB — THYROID PEROXIDASE ANTIBODY: Thyroperoxidase Ab SerPl-aCnc: 12 IU/mL (ref 0–34)

## 2022-11-01 NOTE — Progress Notes (Signed)
HI Nabria. It was nice to see you yesterday.  Your lab work looks good.  You do not need a vitamin D supplement at this time.  Your Vitamin D is well within the normal range.  Your b12 and thyroid labs also look good.  No concerns at this time. Continue with your current medication regimen.  Follow up as discussed.  Please let me know if you have any questions.

## 2022-12-14 ENCOUNTER — Other Ambulatory Visit: Payer: Self-pay | Admitting: Nurse Practitioner

## 2022-12-18 NOTE — Telephone Encounter (Signed)
Requested Prescriptions  Pending Prescriptions Disp Refills   rizatriptan (MAXALT) 10 MG tablet [Pharmacy Med Name: RIZATRIPTAN 10MG  TABLETS] 20 tablet 0    Sig: TAKE 1 TABLET BY MOUTH 1 TIME AS NEEDED FOR MIGRAINE. MAY REPEAT DOSE IN 2 HOURS AS NEEDED AS DIRECTED     Neurology:  Migraine Therapy - Triptan Passed - 12/14/2022  7:18 PM      Passed - Last BP in normal range    BP Readings from Last 1 Encounters:  10/31/22 113/78         Passed - Valid encounter within last 12 months    Recent Outpatient Visits           1 month ago Hair loss   [place] [facility] Larae Grooms, NP   8 months ago Mild episode of recurrent major depressive disorder [facility])   [place] [facility] Larae Grooms, NP   11 months ago Annual physical exam   [place] [facility] Larae Grooms, NP   11 months ago Rhus dermatitis   [place] [facility] [place], Elnita Maxwell, NP   1 year ago Migraine without status migrainosus, not intractable, unspecified migraine type    [facility] Larae Grooms, NP

## 2022-12-22 ENCOUNTER — Other Ambulatory Visit: Payer: Self-pay | Admitting: Nurse Practitioner

## 2022-12-22 NOTE — Telephone Encounter (Signed)
Requested Prescriptions  Pending Prescriptions Disp Refills   venlafaxine (EFFEXOR) 37.5 MG tablet [Pharmacy Med Name: VENLAFAXINE 37.5MG  TABLETS] 180 tablet 0    Sig: TAKE 1 TABLET BY MOUTH TWICE DAILY     Psychiatry: Antidepressants - SNRI - desvenlafaxine & venlafaxine Failed - 12/22/2022 10:10 AM      Failed - Lipid Panel in normal range within the last 12 months    Cholesterol, Total  Date Value Ref Range Status  04/20/2022 197 100 - 199 mg/dL Final   LDL Chol Calc (NIH)  Date Value Ref Range Status  04/20/2022 125 (H) 0 - 99 mg/dL Final   HDL  Date Value Ref Range Status  04/20/2022 57 >39 mg/dL Final   Triglycerides  Date Value Ref Range Status  04/20/2022 80 0 - 149 mg/dL Final         Passed - Cr in normal range and within 360 days    Creatinine, Ser  Date Value Ref Range Status  04/20/2022 0.80 0.57 - 1.00 mg/dL Final         Passed - Completed PHQ-2 or PHQ-9 in the last 360 days      Passed - Last BP in normal range    BP Readings from Last 1 Encounters:  10/31/22 113/78         Passed - Valid encounter within last 6 months    Recent Outpatient Visits           1 month ago Hair loss   [place] [facility] Larae Grooms, NP   8 months ago Mild episode of recurrent major depressive disorder [facility])   [place] [facility] Larae Grooms, NP   11 months ago Annual physical exam   [place] [facility] Larae Grooms, NP   12 months ago Rhus dermatitis   [place] [facility] [place], Elnita Maxwell, NP   1 year ago Migraine without status migrainosus, not intractable, unspecified migraine type   [place] [facility] Larae Grooms, NP

## 2023-01-25 ENCOUNTER — Other Ambulatory Visit: Payer: Self-pay | Admitting: Nurse Practitioner

## 2023-01-26 NOTE — Telephone Encounter (Signed)
Requested Prescriptions  Pending Prescriptions Disp Refills   rizatriptan (MAXALT) 10 MG tablet [Pharmacy Med Name: RIZATRIPTAN 10MG  TABLETS] 20 tablet 0    Sig: TAKE 1 TABLET BY MOUTH 1 TIME AS NEEDED FOR MIGRAINE. MAY REPEAT DOSE IN 2 HOURS AS NEEDED AS DIRECTED     Neurology:  Migraine Therapy - Triptan Passed - 01/25/2023  2:40 PM      Passed - Last BP in normal range    BP Readings from Last 1 Encounters:  10/31/22 113/78         Passed - Valid encounter within last 12 months    Recent Outpatient Visits           2 months ago Hair loss   [place] [facility] Larae Grooms, NP   9 months ago Mild episode of recurrent major depressive disorder [facility])   [place] [facility] Larae Grooms, NP   1 year ago Annual physical exam   [place] [facility] Larae Grooms, NP   1 year ago Rhus dermatitis   [place] [facility] Gabriel Cirri, NP   1 year ago Migraine without status migrainosus, not intractable, unspecified migraine type    [place] Larae Grooms, NP

## 2023-02-07 ENCOUNTER — Encounter: Payer: Self-pay | Admitting: Nurse Practitioner

## 2023-02-07 DIAGNOSIS — R635 Abnormal weight gain: Secondary | ICD-10-CM

## 2023-02-07 DIAGNOSIS — E782 Mixed hyperlipidemia: Secondary | ICD-10-CM

## 2023-03-12 ENCOUNTER — Other Ambulatory Visit: Payer: Self-pay | Admitting: Nurse Practitioner

## 2023-03-13 NOTE — Telephone Encounter (Signed)
Requested Prescriptions  Pending Prescriptions Disp Refills   rizatriptan (MAXALT) 10 MG tablet [Pharmacy Med Name: RIZATRIPTAN 10MG  TABLETS] 90 tablet 0    Sig: TAKE 1 TABLET BY MOUTH 1 TIME AS NEEDED FOR MIGRAINE. MAY REPEAT DOSE IN 2 HOURS AS NEEDED AS DIRECTED     Neurology:  Migraine Therapy - Triptan Passed - 03/12/2023  7:04 PM      Passed - Last BP in normal range    BP Readings from Last 1 Encounters:  10/31/22 113/78         Passed - Valid encounter within last 12 months    Recent Outpatient Visits           4 months ago Hair loss   [place] [facility] Larae Grooms, NP   10 months ago Mild episode of recurrent major depressive disorder [facility])   [place] [facility], [facility]   1 year ago Annual physical exam   [place] [facility] Larae Grooms, NP   1 year ago Rhus dermatitis    [facility] Gabriel Cirri, NP   1 year ago Migraine without status migrainosus, not intractable, unspecified migraine type    [facility] Larae Grooms, NP

## 2023-03-14 ENCOUNTER — Encounter: Payer: Self-pay | Admitting: Nurse Practitioner

## 2023-03-19 ENCOUNTER — Other Ambulatory Visit: Payer: Self-pay | Admitting: Nurse Practitioner

## 2023-03-20 NOTE — Telephone Encounter (Signed)
Patient will need an office visit for further refills. Requested Prescriptions  Pending Prescriptions Disp Refills   venlafaxine (EFFEXOR) 37.5 MG tablet [Pharmacy Med Name: VENLAFAXINE 37.5MG  TABLETS] 180 tablet 0    Sig: TAKE 1 TABLET BY MOUTH TWICE DAILY     Psychiatry: Antidepressants - SNRI - desvenlafaxine & venlafaxine Failed - 03/19/2023  8:01 PM      Failed - Lipid Panel in normal range within the last 12 months    Cholesterol, Total  Date Value Ref Range Status  04/20/2022 197 100 - 199 mg/dL Final   LDL Chol Calc (NIH)  Date Value Ref Range Status  04/20/2022 125 (H) 0 - 99 mg/dL Final   HDL  Date Value Ref Range Status  04/20/2022 57 >39 mg/dL Final   Triglycerides  Date Value Ref Range Status  04/20/2022 80 0 - 149 mg/dL Final         Passed - Cr in normal range and within 360 days    Creatinine, Ser  Date Value Ref Range Status  04/20/2022 0.80 0.57 - 1.00 mg/dL Final         Passed - Completed PHQ-2 or PHQ-9 in the last 360 days      Passed - Last BP in normal range    BP Readings from Last 1 Encounters:  10/31/22 113/78         Passed - Valid encounter within last 6 months    Recent Outpatient Visits           4 months ago Hair loss   [place] [facility] Larae Grooms, NP   11 months ago Mild episode of recurrent major depressive disorder [facility])   [place] [facility] Larae Grooms, NP   1 year ago Annual physical exam   [place] [facility] Larae Grooms, NP   1 year ago Rhus dermatitis   [place] [facility] Gabriel Cirri, NP   1 year ago Migraine without status migrainosus, not intractable, unspecified migraine type   [place] [facility] Larae Grooms, NP

## 2023-04-16 ENCOUNTER — Other Ambulatory Visit: Payer: Self-pay | Admitting: Nurse Practitioner

## 2023-04-16 ENCOUNTER — Encounter: Payer: Self-pay | Admitting: Nurse Practitioner

## 2023-04-17 NOTE — Telephone Encounter (Signed)
Requested medications are due for refill today.  unsure  Requested medications are on the active medications list.  yes  Last refill. 03/13/2023 #90 0 rf  Future visit scheduled.   no  Notes to clinic.  Please review for refill.    Requested Prescriptions  Pending Prescriptions Disp Refills   rizatriptan (MAXALT) 10 MG tablet [Pharmacy Med Name: RIZATRIPTAN 10MG  TABLETS] 90 tablet 0    Sig: TAKE 1 TABLET BY MOUTH ONCE DAILY AS NEEDED FOR MIGRAINE MAY REPEAT DOSE IN 2 HOURS AS NEEDED     Neurology:  Migraine Therapy - Triptan Passed - 04/16/2023  4:06 PM      Passed - Last BP in normal range    BP Readings from Last 1 Encounters:  10/31/22 113/78         Passed - Valid encounter within last 12 months    Recent Outpatient Visits           5 months ago Hair loss   [place] [facility] Larae Grooms, NP   12 months ago Mild episode of recurrent major depressive disorder [facility])   [place] [facility] Larae Grooms, NP   1 year ago Annual physical exam   [place] [facility] Larae Grooms, NP   1 year ago Rhus dermatitis   [facility] Gabriel Cirri, NP   1 year ago Migraine without status migrainosus, not intractable, unspecified migraine type   [place] [facility] Larae Grooms, NP

## 2023-04-18 ENCOUNTER — Other Ambulatory Visit: Payer: Self-pay | Admitting: Nurse Practitioner

## 2023-04-19 ENCOUNTER — Encounter: Payer: Self-pay | Admitting: Nurse Practitioner

## 2023-04-22 MED ORDER — RIZATRIPTAN BENZOATE 10 MG PO TABS: ORAL_TABLET | ORAL | 0 refills | Status: DC

## 2023-05-10 ENCOUNTER — Ambulatory Visit (INDEPENDENT_AMBULATORY_CARE_PROVIDER_SITE_OTHER): Payer: BC Managed Care – PPO | Admitting: Nurse Practitioner

## 2023-05-10 ENCOUNTER — Encounter: Payer: Self-pay | Admitting: Nurse Practitioner

## 2023-05-10 ENCOUNTER — Telehealth: Payer: Self-pay

## 2023-05-10 VITALS — BP 123/79 | HR 76 | Temp 98.2°F | Ht 66.0 in | Wt 171.0 lb

## 2023-05-10 DIAGNOSIS — Z23 Encounter for immunization: Secondary | ICD-10-CM | POA: Diagnosis not present

## 2023-05-10 DIAGNOSIS — E782 Mixed hyperlipidemia: Secondary | ICD-10-CM | POA: Diagnosis not present

## 2023-05-10 DIAGNOSIS — Z0000 Encounter for general adult medical examination without abnormal findings: Secondary | ICD-10-CM

## 2023-05-10 LAB — URINALYSIS, ROUTINE W REFLEX MICROSCOPIC
Bilirubin, UA: NEGATIVE
Glucose, UA: NEGATIVE
Ketones, UA: NEGATIVE
Leukocytes,UA: NEGATIVE
Nitrite, UA: NEGATIVE
Protein,UA: NEGATIVE
RBC, UA: NEGATIVE
Specific Gravity, UA: 1.015 (ref 1.005–1.030)
Urobilinogen, Ur: 0.2 mg/dL (ref 0.2–1.0)
pH, UA: 5.5 (ref 5.0–7.5)

## 2023-05-10 NOTE — Assessment & Plan Note (Signed)
Labs ordered at visit today.  Will make recommendations based on lab results.   

## 2023-05-10 NOTE — Progress Notes (Signed)
BP 123/79 (BP Location: Left Arm, Patient Position: Sitting, Cuff Size: Normal)   Pulse 76   Temp 98.2 F (36.8 C) (Oral)   Ht 5\' 6"  (1.676 m)   Wt 171 lb (77.6 kg)   SpO2 97%   BMI 27.60 kg/m    Subjective:    Patient ID: Ruth Hammond, female    DOB: 1971-04-18, 52 y.o.   MRN: [id number]  HPI: Ruth Hammond is a 52 y.o. female presenting on 05/10/2023 for comprehensive medical examination. Current medical complaints include:none  She currently lives with: Menopausal Symptoms: no  MOOD Patient states her mood has been good.  Denies concerns at visit today.  Effexor is working well for her. Denies SI.     [facility] [employer] [facility] in [place].     Depression Screen done today and results listed below:     04/20/2022    2:30 PM 01/12/2022   11:19 AM 12/27/2021   10:09 AM 07/21/2021    3:37 PM 12/22/2020    9:56 AM  Depression screen PHQ 2/9  Decreased Interest 0 0 0 0 0  Down, Depressed, Hopeless 0 0 0 0 0  PHQ - 2 Score 0 0 0 0 0  Altered sleeping 0 0 0 0 0  Tired, decreased energy 0 0 0 0 0  Change in appetite 0 0 0 0 0  Feeling bad or failure about yourself  0 0 0 0 0  Trouble concentrating 0 0 0 0 0  Moving slowly or fidgety/restless 0 0 0 0 0  Suicidal thoughts 0 0 0 0 0  PHQ-9 Score 0 0 0 0 0  Difficult doing work/chores Not difficult at all Not difficult at all Not difficult at all  Not difficult at all    The patient does not have a history of falls. I did complete a risk assessment for falls. A plan of care for falls was documented.   Past Medical History:  Past Medical History:  Diagnosis Date   Anxiety    Complication of anesthesia    Depression    PONV (postoperative nausea and vomiting)     Surgical History:  Past Surgical History:  Procedure Laterality Date   COLONOSCOPY N/A 01/04/2021   Procedure: COLONOSCOPY;  Surgeon: Wyline Mood, MD;  Location: [facility] ENDOSCOPY;  Service: Gastroenterology;  Laterality: N/A;   DILATION AND CURETTAGE  OF UTERUS     neck fusion  10/2015   TUBAL LIGATION     WISDOM TOOTH EXTRACTION      Medications:  Current Outpatient Medications on File Prior to Visit  Medication Sig   Cyanocobalamin (VITAMIN B 12 PO) Take by mouth daily.   eletriptan (RELPAX) 40 MG tablet TAKE 1 TABLET BY MOUTH AS NEEDED FOR MIGRAINE OR HEADACHE. MAY REPEAT IN 2 HOURS IF HEADACHE PERSISTS OR RECURS   ibuprofen (ADVIL,MOTRIN) 100 MG/5ML suspension Take 200 mg by mouth daily.   Multiple Vitamin (MULTIVITAMIN) tablet Take 1 tablet by mouth daily.   nitrofurantoin, macrocrystal-monohydrate, (MACROBID) 100 MG capsule Take one capsule after intercourse as needed for UTI prevention.   promethazine (PHENERGAN) 25 MG tablet TAKE 1 TABLET(25 MG) BY MOUTH EVERY 8 HOURS AS NEEDED FOR NAUSEA OR VOMITING   rizatriptan (MAXALT) 10 MG tablet TAKE 1 TABLET BY MOUTH 1 TIME AS NEEDED FOR MIGRAINE. MAY REPEAT DOSE IN 2 HOURS AS NEEDED AS DIRECTED   venlafaxine (EFFEXOR) 37.5 MG tablet TAKE 1 TABLET BY MOUTH TWICE DAILY   No current facility-administered  medications on file prior to visit.    Allergies:  Allergies  Allergen Reactions   Bee Venom Swelling    Bee stings   Poison Ivy Extract Hives   Other Swelling    Bee stings    Social History:  Social History   Socioeconomic History   Marital status: Married    Spouse name: Not on file   Number of children: Not on file   Years of education: Not on file   Highest education level: Not on file  Occupational History   Not on file  Tobacco Use   Smoking status: Never   Smokeless tobacco: Never  Vaping Use   Vaping status: Never Used  Substance and Sexual Activity   Alcohol use: No   Drug use: No   Sexual activity: Not Currently  Other Topics Concern   Not on file  Social History Narrative   Not on file   Social Determinants of Health   Financial Resource Strain: Not on file  Food Insecurity: Not on file  Transportation Needs: Not on file  Physical Activity: Not  on file  Stress: Not on file  Social Connections: Not on file  Intimate Partner Violence: Not on file   Social History   Tobacco Use  Smoking Status Never  Smokeless Tobacco Never   Social History   Substance and Sexual Activity  Alcohol Use No    Family History:  Family History  Problem Relation Age of Onset   Hyperlipidemia Mother    Hypertension Mother    Stroke Mother    Diabetes Mother    Heart disease Mother    Cancer Father        bone   Hyperlipidemia Sister    Hypertension Sister    Stroke Sister    Cancer Maternal Grandmother        ovarian or cervical not sure   Breast cancer Neg Hx     Past medical history, surgical history, medications, allergies, family history and social history reviewed with patient today and changes made to appropriate areas of the chart.   Review of Systems  Psychiatric/Behavioral:  Positive for depression. Negative for suicidal ideas. The patient is nervous/anxious.    All other ROS negative except what is listed above and in the HPI.      Objective:    BP 123/79 (BP Location: Left Arm, Patient Position: Sitting, Cuff Size: Normal)   Pulse 76   Temp 98.2 F (36.8 C) (Oral)   Ht 5\' 6"  (1.676 m)   Wt 171 lb (77.6 kg)   SpO2 97%   BMI 27.60 kg/m   Wt Readings from Last 3 Encounters:  05/10/23 171 lb (77.6 kg)  10/31/22 171 lb 4.8 oz (77.7 kg)  04/20/22 168 lb 4.8 oz (76.3 kg)    Physical Exam Vitals and nursing note reviewed.  Constitutional:      General: She is awake. She is not in acute distress.    Appearance: Normal appearance. She is well-developed and normal weight. She is not ill-appearing.  HENT:     Head: Normocephalic and atraumatic.     Right Ear: Hearing, tympanic membrane, ear canal and external ear normal. No drainage.     Left Ear: Hearing, tympanic membrane, ear canal and external ear normal. No drainage.     Nose: Nose normal.     Right Sinus: No maxillary sinus tenderness or frontal sinus  tenderness.     Left Sinus: No maxillary sinus tenderness or frontal  sinus tenderness.     Mouth/Throat:     Mouth: Mucous membranes are moist.     Pharynx: Oropharynx is clear. Uvula midline. No pharyngeal swelling, oropharyngeal exudate or posterior oropharyngeal erythema.  Eyes:     General: Lids are normal.        Right eye: No discharge.        Left eye: No discharge.     Extraocular Movements: Extraocular movements intact.     Conjunctiva/sclera: Conjunctivae normal.     Pupils: Pupils are equal, round, and reactive to light.     Visual Fields: Right eye visual fields normal and left eye visual fields normal.  Neck:     Thyroid: No thyromegaly.     Vascular: No carotid bruit.     Trachea: Trachea normal.  Cardiovascular:     Rate and Rhythm: Normal rate and regular rhythm.     Heart sounds: Normal heart sounds. No murmur heard.    No gallop.  Pulmonary:     Effort: Pulmonary effort is normal. No accessory muscle usage or respiratory distress.     Breath sounds: Normal breath sounds.  Chest:  Breasts:    Right: Normal.     Left: Normal.  Abdominal:     General: Bowel sounds are normal.     Palpations: Abdomen is soft. There is no hepatomegaly or splenomegaly.     Tenderness: There is no abdominal tenderness.  Musculoskeletal:        General: Normal range of motion.     Cervical back: Normal range of motion and neck supple.     Right lower leg: No edema.     Left lower leg: No edema.  Lymphadenopathy:     Head:     Right side of head: No submental, submandibular, tonsillar, preauricular or posterior auricular adenopathy.     Left side of head: No submental, submandibular, tonsillar, preauricular or posterior auricular adenopathy.     Cervical: No cervical adenopathy.     Upper Body:     Right upper body: No supraclavicular, axillary or pectoral adenopathy.     Left upper body: No supraclavicular, axillary or pectoral adenopathy.  Skin:    General: Skin is warm and  dry.     Capillary Refill: Capillary refill takes less than 2 seconds.     Findings: No rash.  Neurological:     Mental Status: She is alert and oriented to person, place, and time.     Gait: Gait is intact.     Deep Tendon Reflexes: Reflexes are normal and symmetric.     Reflex Scores:      Brachioradialis reflexes are 2+ on the right side and 2+ on the left side.      Patellar reflexes are 2+ on the right side and 2+ on the left side. Psychiatric:        Attention and Perception: Attention normal.        Mood and Affect: Mood normal.        Speech: Speech normal.        Behavior: Behavior normal. Behavior is cooperative.        Thought Content: Thought content normal.        Judgment: Judgment normal.     Results for orders placed or performed in visit on 10/31/22  Vitamin D (25 hydroxy)  Result Value Ref Range   Vit D, 25-Hydroxy 53.2 30.0 - 100.0 ng/mL  Thyroid Panel With TSH  Result Value Ref Range  TSH 1.970 0.450 - 4.500 uIU/mL   T4, Total 6.4 4.5 - 12.0 ug/dL   T3 Uptake Ratio 24 24 - 39 %   Free Thyroxine Index 1.5 1.2 - 4.9  Thyroid peroxidase antibody  Result Value Ref Range   Thyroperoxidase Ab SerPl-aCnc 12 0 - 34 IU/mL  B12  Result Value Ref Range   Vitamin B-12 747 232 - 1,245 pg/mL      Assessment & Plan:   Problem List Items Addressed This Visit       Other   Mixed hyperlipidemia    Labs ordered at visit today.  Will make recommendations based on lab results.        Relevant Orders   Lipid panel   Other Visit Diagnoses     Annual physical exam    -  Primary   Health maintenance reviewed during visit today.  Labs ordered.  Vaccines reviewed.  PAP and Mammogram will be ordered by GYN. Colonoscopy up to date.   Relevant Orders   CBC with Differential/Platelet   Comprehensive metabolic panel   Lipid panel   TSH   Urinalysis, Routine w reflex microscopic   Need for influenza vaccination       Relevant Orders   Flu vaccine trivalent PF, 6mos  and older(Flulaval,Afluria,Fluarix,Fluzone)        Follow up plan: Return in about 6 months (around 11/07/2023) for Depression/Anxiety FU.   LABORATORY TESTING:  - Pap smear: pap done  IMMUNIZATIONS:   - Tdap: Tetanus vaccination status reviewed: last tetanus booster within 10 years. - Influenza: Postponed to flu season - Pneumovax: Not applicable - Prevnar: Not applicable - COVID: Not applicable - HPV: Not applicable - Shingrix vaccine: Up to date  SCREENING: -Mammogram: Up to date  - Colonoscopy: Up to date  - Bone Density: Not applicable  -Hearing Test: Not applicable  -Spirometry: Not applicable   PATIENT COUNSELING:   Advised to take 1 mg of folate supplement per day if capable of pregnancy.   Sexuality: Discussed sexually transmitted diseases, partner selection, use of condoms, avoidance of unintended pregnancy  and contraceptive alternatives.   Advised to avoid cigarette smoking.  I discussed with the patient that most people either abstain from alcohol or drink within safe limits (<=14/week and <=4 drinks/occasion for males, <=7/weeks and <= 3 drinks/occasion for females) and that the risk for alcohol disorders and other health effects rises proportionally with the number of drinks per week and how often a drinker exceeds daily limits.  Discussed cessation/primary prevention of drug use and availability of treatment for abuse.   Diet: Encouraged to adjust caloric intake to maintain  or achieve ideal body weight, to reduce intake of dietary saturated fat and total fat, to limit sodium intake by avoiding high sodium foods and not adding table salt, and to maintain adequate dietary potassium and calcium preferably from fresh fruits, vegetables, and low-fat dairy products.    stressed the importance of regular exercise  Injury prevention: Discussed safety belts, safety helmets, smoke detector, smoking near bedding or upholstery.   Dental health: Discussed importance of  regular tooth brushing, flossing, and dental visits.    NEXT PREVENTATIVE PHYSICAL DUE IN 1 YEAR. Return in about 6 months (around 11/07/2023) for Depression/Anxiety FU.

## 2023-05-10 NOTE — Telephone Encounter (Signed)
Mammogram and PAP have been requested via [facility].

## 2023-05-10 NOTE — Telephone Encounter (Signed)
-----   Message from Larae Grooms sent at 05/10/2023  3:17 PM EST ----- Can we call and request her records from [facility] in [place].  They do her PAP and Mammogram  She also got a TDAP- can we check [facility] for that?

## 2023-05-11 ENCOUNTER — Encounter: Payer: Self-pay | Admitting: Nurse Practitioner

## 2023-05-11 LAB — CBC WITH DIFFERENTIAL/PLATELET
Basophils Absolute: 0.1 10*3/uL (ref 0.0–0.2)
Basos: 1 %
EOS (ABSOLUTE): 0.2 10*3/uL (ref 0.0–0.4)
Eos: 4 %
Hematocrit: 37.1 % (ref 34.0–46.6)
Hemoglobin: 12 g/dL (ref 11.1–15.9)
Immature Grans (Abs): 0 10*3/uL (ref 0.0–0.1)
Immature Granulocytes: 0 %
Lymphocytes Absolute: 1.5 10*3/uL (ref 0.7–3.1)
Lymphs: 28 %
MCH: 29.9 pg (ref 26.6–33.0)
MCHC: 32.3 g/dL (ref 31.5–35.7)
MCV: 93 fL (ref 79–97)
Monocytes Absolute: 0.4 10*3/uL (ref 0.1–0.9)
Monocytes: 8 %
Neutrophils Absolute: 3 10*3/uL (ref 1.4–7.0)
Neutrophils: 59 %
Platelets: 315 10*3/uL (ref 150–450)
RBC: 4.01 x10E6/uL (ref 3.77–5.28)
RDW: 13 % (ref 11.7–15.4)
WBC: 5.2 10*3/uL (ref 3.4–10.8)

## 2023-05-11 LAB — COMPREHENSIVE METABOLIC PANEL
ALT: 11 [IU]/L (ref 0–32)
AST: 15 [IU]/L (ref 0–40)
Albumin: 4.5 g/dL (ref 3.8–4.9)
Alkaline Phosphatase: 47 [IU]/L (ref 44–121)
BUN/Creatinine Ratio: 24 — ABNORMAL HIGH (ref 9–23)
BUN: 18 mg/dL (ref 6–24)
Bilirubin Total: <0.2 mg/dL (ref 0.0–1.2)
CO2: 24 mmol/L (ref 20–29)
Calcium: 9.6 mg/dL (ref 8.7–10.2)
Chloride: 102 mmol/L (ref 96–106)
Creatinine, Ser: 0.74 mg/dL (ref 0.57–1.00)
Globulin, Total: 2 g/dL (ref 1.5–4.5)
Glucose: 85 mg/dL (ref 70–99)
Potassium: 4.2 mmol/L (ref 3.5–5.2)
Sodium: 140 mmol/L (ref 134–144)
Total Protein: 6.5 g/dL (ref 6.0–8.5)
eGFR: 97 mL/min/{1.73_m2} (ref >59)

## 2023-05-11 LAB — TSH: TSH: 1.63 u[IU]/mL (ref 0.450–4.500)

## 2023-05-11 LAB — LIPID PANEL
Chol/HDL Ratio: 4.1 ratio (ref 0.0–4.4)
Cholesterol, Total: 195 mg/dL (ref 100–199)
HDL: 48 mg/dL (ref >39)
LDL Chol Calc (NIH): 128 mg/dL — ABNORMAL HIGH (ref 0–99)
Triglycerides: 105 mg/dL (ref 0–149)
VLDL Cholesterol Cal: 19 mg/dL (ref 5–40)

## 2023-05-30 ENCOUNTER — Other Ambulatory Visit: Payer: Self-pay | Admitting: Nurse Practitioner

## 2023-05-31 NOTE — Telephone Encounter (Signed)
Requested medications are due for refill today.  yes  Requested medications are on the active medications list.  yes  Last refill. 09/22/2022 #40 1 rf  Future visit scheduled.   no  Notes to clinic.  Refill not delegated.    Requested Prescriptions  Pending Prescriptions Disp Refills   promethazine (PHENERGAN) 25 MG tablet [Pharmacy Med Name: PROMETHAZINE 25MG  TABLETS] 40 tablet 1    Sig: TAKE 1 TABLET(25 MG) BY MOUTH EVERY 8 HOURS AS NEEDED FOR NAUSEA OR VOMITING     Not Delegated - Gastroenterology: Antiemetics Failed - 05/31/2023  7:56 AM      Failed - This refill cannot be delegated      Passed - Valid encounter within last 6 months    Recent Outpatient Visits           3 weeks ago Annual physical exam   [place] [facility] Larae Grooms, NP   7 months ago Hair loss   [place] [facility] Larae Grooms, NP   1 year ago Mild episode of recurrent major depressive disorder [facility])   [place] [facility] Larae Grooms, NP   1 year ago Annual physical exam   [place] [facility] Larae Grooms, NP   1 year ago Rhus dermatitis    [facility] Gabriel Cirri, NP

## 2023-06-16 ENCOUNTER — Other Ambulatory Visit: Payer: Self-pay | Admitting: Nurse Practitioner

## 2023-06-21 ENCOUNTER — Encounter: Payer: Self-pay | Admitting: Nurse Practitioner

## 2023-06-21 MED ORDER — VENLAFAXINE HCL 37.5 MG PO TABS: ORAL_TABLET | ORAL | 1 refills | Status: DC

## 2023-06-21 MED ORDER — RIZATRIPTAN BENZOATE 10 MG PO TABS: ORAL_TABLET | ORAL | 0 refills | Status: DC

## 2023-06-21 NOTE — Telephone Encounter (Signed)
 Requested Prescriptions  Refused Prescriptions Disp Refills   venlafaxine  (EFFEXOR ) 37.5 MG tablet [Pharmacy Med Name: VENLAFAXINE  37.5MG  TABLETS] 180 tablet 0    Sig: TAKE 1 TABLET BY MOUTH TWICE DAILY     Psychiatry: Antidepressants - SNRI - desvenlafaxine & venlafaxine  Failed - 06/21/2023 12:28 PM      Failed - Completed PHQ-2 or PHQ-9 in the last 360 days      Failed - Lipid Panel in normal range within the last 12 months    Cholesterol, Total  Date Value Ref Range Status  05/10/2023 195 100 - 199 mg/dL Final   LDL Chol Calc (NIH)  Date Value Ref Range Status  05/10/2023 128 (H) 0 - 99 mg/dL Final   HDL  Date Value Ref Range Status  05/10/2023 48 >39 mg/dL Final   Triglycerides  Date Value Ref Range Status  05/10/2023 105 0 - 149 mg/dL Final         Passed - Cr in normal range and within 360 days    Creatinine, Ser  Date Value Ref Range Status  05/10/2023 0.74 0.57 - 1.00 mg/dL Final         Passed - Last BP in normal range    BP Readings from Last 1 Encounters:  05/10/23 123/79         Passed - Valid encounter within last 6 months    Recent Outpatient Visits           1 month ago Annual physical exam   [place] [facility] Melvin Pao, NP   7 months ago Hair loss   [place] [facility] Melvin Pao, NP   1 year ago Mild episode of recurrent major depressive disorder [facility])   [place] [facility] Melvin Pao, NP   1 year ago Annual physical exam   [place] [facility] Melvin Pao, NP   1 year ago Rhus dermatitis   [place] [facility] Medora, Channing, NP               rizatriptan  (MAXALT ) 10 MG tablet [Pharmacy Med Name: RIZATRIPTAN  10MG  TABLETS] 20 tablet 0    Sig: TAKE 1 TABLET BY MOUTH 1 TIME AS NEEDED FOR MIGRAINE. MAY REPEAT DOSE IN 2 HOURS AS NEEDED AS DIRECTED     Neurology:  Migraine Therapy - Triptan Passed - 06/21/2023 12:28 PM      Passed -  Last BP in normal range    BP Readings from Last 1 Encounters:  05/10/23 123/79         Passed - Valid encounter within last 12 months    Recent Outpatient Visits           1 month ago Annual physical exam   [place] [facility] Melvin Pao, NP   7 months ago Hair loss   [place] [facility] Melvin Pao, NP   1 year ago Mild episode of recurrent major depressive disorder [facility]   [place] [facility] Melvin Pao, NP   1 year ago Annual physical exam   [place] [facility] Melvin Pao, NP   1 year ago Rhus dermatitis   [place] [facility] Daphane Channing, NP

## 2023-07-28 ENCOUNTER — Other Ambulatory Visit: Payer: Self-pay | Admitting: Nurse Practitioner

## 2023-07-30 NOTE — Telephone Encounter (Signed)
 Requested Prescriptions  Pending Prescriptions Disp Refills   rizatriptan  (MAXALT ) 10 MG tablet [Pharmacy Med Name: RIZATRIPTAN  10MG  TABLETS] 20 tablet 0    Sig: TAKE 1 TABLET BY MOUTH 1 TIME AS NEEDED FOR MIGRAINE. MAY REPEAT DOSE IN 2 HOURS AS NEEDED AS DIRECTED     Neurology:  Migraine Therapy - Triptan Passed - 07/30/2023 12:52 PM      Passed - Last BP in normal range    BP Readings from Last 1 Encounters:  05/10/23 123/79         Passed - Valid encounter within last 12 months    Recent Outpatient Visits           2 months ago Annual physical exam   [place] [facility] Aileen Alexanders, NP   9 months ago Hair loss   [facility] Aileen Alexanders, NP   1 year ago Mild episode of recurrent major depressive disorder [facility])   [place] [facility] Aileen Alexanders, NP   1 year ago Annual physical exam   [place] [facility] Aileen Alexanders, NP   1 year ago Rhus dermatitis   [place] [facility] Cindee Crazier, NP

## 2023-08-11 ENCOUNTER — Other Ambulatory Visit: Payer: Self-pay | Admitting: Nurse Practitioner

## 2023-08-13 ENCOUNTER — Encounter: Payer: Self-pay | Admitting: Nurse Practitioner

## 2023-08-13 MED ORDER — ELETRIPTAN HYDROBROMIDE 40 MG PO TABS: ORAL_TABLET | ORAL | 2 refills | Status: DC

## 2023-08-13 NOTE — Telephone Encounter (Signed)
 Duplicate request, lasted refilled 08/12/22.  Requested Prescriptions  Pending Prescriptions Disp Refills   eletriptan (RELPAX) 40 MG tablet [Pharmacy Med Name: ELETRIPTAN 40MG  TABLETS] 30 tablet 2    Sig: TAKE 1 TABLET BY MOUTH AS NEEDED FOR MIGRAINE OR HEADACHE, MAY REPEAT IN 2 HOURS IF HEADACHE PERSISTS OR RECURS     Neurology:  Migraine Therapy - Triptan Passed - 08/13/2023  4:00 PM      Passed - Last BP in normal range    BP Readings from Last 1 Encounters:  05/10/23 123/79         Passed - Valid encounter within last 12 months    Recent Outpatient Visits           3 months ago Annual physical exam   [place] [facility] Larae Grooms, NP   9 months ago Hair loss   [facility] Larae Grooms, NP   1 year ago Mild episode of recurrent major depressive disorder [facility])   [place] [facility] Larae Grooms, NP   1 year ago Annual physical exam   [place] [facility] Larae Grooms, NP   1 year ago Rhus dermatitis    [facility] Gabriel Cirri, NP

## 2023-09-18 ENCOUNTER — Other Ambulatory Visit: Payer: Self-pay | Admitting: Nurse Practitioner

## 2023-09-20 NOTE — Telephone Encounter (Signed)
 Requested Prescriptions  Refused Prescriptions Disp Refills   venlafaxine (EFFEXOR) 37.5 MG tablet [Pharmacy Med Name: VENLAFAXINE 37.5MG  TABLETS] 180 tablet 1    Sig: TAKE 1 TABLET BY MOUTH TWICE DAILY     Psychiatry: Antidepressants - SNRI - desvenlafaxine & venlafaxine Failed - 09/20/2023  9:22 AM      Failed - Completed PHQ-2 or PHQ-9 in the last 360 days      Failed - Valid encounter within last 6 months    Recent Outpatient Visits   None            Failed - Lipid Panel in normal range within the last 12 months    Cholesterol, Total  Date Value Ref Range Status  05/10/2023 195 100 - 199 mg/dL Final   LDL Chol Calc (NIH)  Date Value Ref Range Status  05/10/2023 128 (H) 0 - 99 mg/dL Final   HDL  Date Value Ref Range Status  05/10/2023 48 >39 mg/dL Final   Triglycerides  Date Value Ref Range Status  05/10/2023 105 0 - 149 mg/dL Final         Passed - Cr in normal range and within 360 days    Creatinine, Ser  Date Value Ref Range Status  05/10/2023 0.74 0.57 - 1.00 mg/dL Final         Passed - Last BP in normal range    BP Readings from Last 1 Encounters:  05/10/23 123/79

## 2023-10-13 ENCOUNTER — Other Ambulatory Visit: Payer: Self-pay | Admitting: Nurse Practitioner

## 2023-10-15 NOTE — Telephone Encounter (Signed)
 Requested Prescriptions  Pending Prescriptions Disp Refills   rizatriptan  (MAXALT ) 10 MG tablet [Pharmacy Med Name: RIZATRIPTAN  10MG  TABLETS] 20 tablet 0    Sig: TAKE 1 TABLET BY MOUTH 1 TIME AS NEEDED FOR MIGRAINE. MAY REPEAT DOSE IN 2 HOURS AS NEEDED AS DIRECTED     Neurology:  Migraine Therapy - Triptan Failed - 10/15/2023 12:23 PM      Failed - Valid encounter within last 12 months    Recent Outpatient Visits   None            Passed - Last BP in normal range    BP Readings from Last 1 Encounters:  05/10/23 123/79

## 2023-10-29 ENCOUNTER — Ambulatory Visit: Admitting: Nurse Practitioner

## 2023-11-16 ENCOUNTER — Other Ambulatory Visit: Payer: Self-pay | Admitting: Nurse Practitioner

## 2023-11-19 NOTE — Telephone Encounter (Signed)
 Requested medication (s) are due for refill today: yes  Requested medication (s) are on the active medication list: yes  Last refill:  05/31/23 #40/1  Future visit scheduled: no  Notes to clinic:  Unable to refill per protocol, cannot delegate.      Requested Prescriptions  Pending Prescriptions Disp Refills   promethazine  (PHENERGAN ) 25 MG tablet [Pharmacy Med Name: PROMETHAZINE  25MG  TABLETS] 40 tablet 1    Sig: TAKE 1 TABLET(25 MG) BY MOUTH EVERY 8 HOURS AS NEEDED FOR NAUSEA OR VOMITING     Not Delegated - Gastroenterology: Antiemetics Failed - 11/19/2023 11:53 AM      Failed - This refill cannot be delegated      Failed - Valid encounter within last 6 months    Recent Outpatient Visits   None

## 2023-12-03 ENCOUNTER — Encounter: Payer: Self-pay | Admitting: Nurse Practitioner

## 2023-12-10 MED ORDER — PROMETHAZINE HCL 25 MG PO TABS: 25.0000 mg | ORAL_TABLET | Freq: Three times a day (TID) | ORAL | 1 refills | Status: DC | PRN

## 2023-12-17 ENCOUNTER — Encounter: Payer: Self-pay | Admitting: Nurse Practitioner

## 2023-12-17 ENCOUNTER — Ambulatory Visit: Admitting: Nurse Practitioner

## 2023-12-17 VITALS — BP 132/87 | HR 76 | Ht 66.5 in | Wt 163.0 lb

## 2023-12-17 DIAGNOSIS — M79675 Pain in left toe(s): Secondary | ICD-10-CM | POA: Diagnosis not present

## 2023-12-17 MED ORDER — PREDNISONE 10 MG PO TABS: 10.0000 mg | ORAL_TABLET | Freq: Every day | ORAL | 0 refills | Status: DC

## 2023-12-17 NOTE — Progress Notes (Signed)
 BP 132/87   Pulse 76   Ht 5' 6.5 (1.689 m)   Wt 163 lb (73.9 kg)   BMI 25.91 kg/m    Subjective:    Patient ID: Ruth Hammond, female    DOB: 06/08/1971, 53 y.o.   MRN: [id number]  HPI: Ruth Hammond is a 53 y.o. female  Chief Complaint  Patient presents with   foot pain    Left foot first joint--sharp burning pain, tender especially when wearing closed shoes. Right foot bottom-thick dry skin and get sore.   FOOT PAIN Patient states she started a clogging class and she felt like her shoes were tight. The pain wouldn't go away after she took the shoe off.  Patient states last night it woke he up in the middle of the night.  States it is a sharp, shooting pain.  Duration: months Involved foot: left Mechanism of injury: none Location: left great toe Onset: gradual  Severity: 7/10  Quality:  sharp and shooting Frequency: intermittent not sure how long episodes last without shoes.  States when she is wearing shoes it hurts most of the time while she is wearing them.  Radiation: no Aggravating factors: and wearing shoes that press on the great toe and weight bearing  Alleviating factors: nothing  Status: worse Treatments attempted: ibuprofen  Relief with NSAIDs?:  mild Weakness with weight bearing or walking: no Morning stiffness: no Swelling: yes Redness: no Bruising: no Paresthesias / decreased sensation: no  Fevers:no  Patient states she has a callus on her right foot.  The Callus has split.    Relevant past medical, surgical, family and social history reviewed and updated as indicated. Interim medical history since our last visit reviewed. Allergies and medications reviewed and updated.  Review of Systems  Musculoskeletal:        Left great toe pain  Skin:        Right foot callus    Per HPI unless specifically indicated above     Objective:    BP 132/87   Pulse 76   Ht 5' 6.5 (1.689 m)   Wt 163 lb (73.9 kg)   BMI 25.91 kg/m   Wt Readings from  Last 3 Encounters:  12/17/23 163 lb (73.9 kg)  05/10/23 171 lb (77.6 kg)  10/31/22 171 lb 4.8 oz (77.7 kg)    Physical Exam Vitals and nursing note reviewed.  Constitutional:      General: She is not in acute distress.    Appearance: Normal appearance. She is normal weight. She is not ill-appearing, toxic-appearing or diaphoretic.  HENT:     Head: Normocephalic.     Right Ear: External ear normal.     Left Ear: External ear normal.     Nose: Nose normal.     Mouth/Throat:     Mouth: Mucous membranes are moist.     Pharynx: Oropharynx is clear.   Eyes:     General:        Right eye: No discharge.        Left eye: No discharge.     Extraocular Movements: Extraocular movements intact.     Conjunctiva/sclera: Conjunctivae normal.     Pupils: Pupils are equal, round, and reactive to light.    Cardiovascular:     Rate and Rhythm: Normal rate and regular rhythm.     Heart sounds: No murmur heard. Pulmonary:     Effort: Pulmonary effort is normal. No respiratory distress.     Breath  sounds: Normal breath sounds. No wheezing or rales.   Musculoskeletal:     Cervical back: Normal range of motion and neck supple.     Left foot: Normal range of motion and normal capillary refill. Bunion and tenderness (some tenderness with palpation) present. No swelling, deformity, Charcot foot, foot drop, prominent metatarsal heads, laceration, bony tenderness or crepitus. Normal pulse.       Feet:  Feet:     Left foot:     Skin integrity: No ulcer, blister, skin breakdown, erythema, warmth, callus, dry skin or fissure.   Skin:    General: Skin is warm and dry.     Capillary Refill: Capillary refill takes less than 2 seconds.   Neurological:     General: No focal deficit present.     Mental Status: She is alert and oriented to person, place, and time. Mental status is at baseline.   Psychiatric:        Mood and Affect: Mood normal.        Behavior: Behavior normal.        Thought Content:  Thought content normal.        Judgment: Judgment normal.     Results for orders placed or performed in visit on 05/11/23  HM MAMMOGRAPHY   Collection Time: 09/18/22  3:25 PM  Result Value Ref Range   HM Mammogram 0-4 Bi-Rad 0-4 Bi-Rad, Self Reported Normal      Assessment & Plan:   Problem List Items Addressed This Visit   None Visit Diagnoses       Great toe pain, left    -  Primary   Will treat with prednisone . Xray ordered.  Referral placed for Podiatry.   Relevant Orders   DG Foot Complete Left   Ambulatory referral to Podiatry        Follow up plan: No follow-ups on file.

## 2023-12-25 ENCOUNTER — Other Ambulatory Visit: Payer: Self-pay | Admitting: Nurse Practitioner

## 2023-12-26 ENCOUNTER — Encounter: Payer: Self-pay | Admitting: Nurse Practitioner

## 2023-12-26 MED ORDER — VENLAFAXINE HCL 37.5 MG PO TABS: ORAL_TABLET | ORAL | 0 refills | Status: DC

## 2023-12-27 NOTE — Telephone Encounter (Signed)
 Requested Prescriptions  Refused Prescriptions Disp Refills   venlafaxine  (EFFEXOR ) 37.5 MG tablet [Pharmacy Med Name: VENLAFAXINE  37.5MG  TABLETS] 180 tablet 1    Sig: TAKE 1 TABLET BY MOUTH TWICE DAILY     Psychiatry: Antidepressants - SNRI - desvenlafaxine & venlafaxine  Failed - 12/27/2023  4:38 PM      Failed - Lipid Panel in normal range within the last 12 months    Cholesterol, Total  Date Value Ref Range Status  05/10/2023 195 100 - 199 mg/dL Final   LDL Chol Calc (NIH)  Date Value Ref Range Status  05/10/2023 128 (H) 0 - 99 mg/dL Final   HDL  Date Value Ref Range Status  05/10/2023 48 >39 mg/dL Final   Triglycerides  Date Value Ref Range Status  05/10/2023 105 0 - 149 mg/dL Final         Passed - Cr in normal range and within 360 days    Creatinine, Ser  Date Value Ref Range Status  05/10/2023 0.74 0.57 - 1.00 mg/dL Final         Passed - Completed PHQ-2 or PHQ-9 in the last 360 days      Passed - Last BP in normal range    BP Readings from Last 1 Encounters:  12/17/23 132/87         Passed - Valid encounter within last 6 months    Recent Outpatient Visits           1 week ago Great toe pain, left   [facility] Melvin Pao, NP

## 2024-01-02 ENCOUNTER — Ambulatory Visit: Payer: Self-pay | Admitting: Podiatry

## 2024-01-02 ENCOUNTER — Ambulatory Visit (INDEPENDENT_AMBULATORY_CARE_PROVIDER_SITE_OTHER)

## 2024-01-02 VITALS — Ht 66.5 in | Wt 163.0 lb

## 2024-01-02 DIAGNOSIS — M2012 Hallux valgus (acquired), left foot: Secondary | ICD-10-CM

## 2024-01-02 DIAGNOSIS — M21612 Bunion of left foot: Secondary | ICD-10-CM | POA: Diagnosis not present

## 2024-01-02 DIAGNOSIS — R234 Changes in skin texture: Secondary | ICD-10-CM

## 2024-01-02 DIAGNOSIS — M21619 Bunion of unspecified foot: Secondary | ICD-10-CM

## 2024-01-02 MED ORDER — CLOBETASOL PROPIONATE 0.05 % EX CREA: 1.0000 | TOPICAL_CREAM | Freq: Every day | CUTANEOUS | 0 refills | Status: AC

## 2024-01-02 NOTE — Patient Instructions (Addendum)
  VISIT SUMMARY: Today, we discussed the pain you are experiencing in your left foot due to a bunion and the cracked skin on your right big toe. We reviewed your symptoms, examined your foot, and discussed treatment options to help manage your conditions.  YOUR PLAN: -BUNION, LEFT FOOT: A bunion is a bony bump that forms on the joint at the base of your big toe, causing pain and discomfort. To manage this, I recommend changing your shoes to avoid tightness and pressure on the bunion, using a bunion shield with a spacer, and taking Motrin for pain control as needed. If these measures do not help, we can consider a minimally invasive bunionectomy surgery, which we can schedule for after Thanksgiving to allow for a month of recovery in December.  -CRACKED SKIN ON RIGHT HALLUX: The cracked skin on your right big toe is likely due to a hereditary or developmental issue, causing a pulling sensation. To manage this, I am prescribing a topical steroid cream and recommending an over-the-counter urea cream for exfoliation. Use a pumice stone or file after showering to manage the callus, and alternate the use of the urea cream and steroid cream, applying one in the morning and the other in the evening.  INSTRUCTIONS: We will schedule your bunion surgery for after Thanksgiving to allow for a month of recovery in December. Please follow the treatment plan for your bunion and cracked skin, and let us  know if you have any questions or concerns.                      Contains text generated by Abridge.                                 Contains text generated by Abridge.

## 2024-01-02 NOTE — Progress Notes (Signed)
 Subjective:  Patient ID: Ruth Hammond, female    DOB: 10/31/1970,  MRN: [id number]  Chief Complaint  Patient presents with   Bunions   Callouses    Rm 3 Patient is here for left foot bunion and cracked skin on the bottom of the right foot ( near the right hallux).    Discussed the use of AI scribe software for clinical note transcription with the patient, who gave verbal consent to proceed.  History of Present Illness Ruth Hammond is a 53 year old female who presents with left foot bunion pain and cracked skin.  She experiences pain in her left foot at the medial side, especially when pressure is applied. The pain began in February and persists even without shoes. There is no history of injury to the area. She started clogging a year ago and initially noticed tightness in her shoes, but the pain has since become more persistent. No family history of bunions. She has not had any prior treatments for the bunion.  In addition to the bunion, she reports a longstanding crease on the plantar medial side of her right hallux, which occasionally cracks. This has been occurring over the past three to six months. She describes the sensation as 'pulling' but denies itching or burning. She uses a pumice stone on her feet after showering to manage the condition.  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 She initially considered the possibility of gout, as her husband has the condition, but she has not experienced the severe pain, redness, or swelling typical of gout.      Objective:    Physical Exam VASCULAR: DP and PT pulse palpable. Foot is warm and well-perfused. Capillary fill time is brisk. DERMATOLOGIC: Cracking fissure on plantar medial right hallux sulcus without open ulceration or infection. Normal skin turgor, texture, and temperature. No open lesions, rashes, or ulcerations. NEUROLOGIC: Normal sensation to light touch and pressure. No paresthesias on examination. ORTHOPEDIC: Mild  to moderate hallux valgus deformity with tenderness at medial eminence and dorsal MTP joint. Smooth pain-free range of motion of all examined joints. No ecchymosis or bruising. No gross deformity.  There is no hypermobility   No images are attached to the encounter.    Results Left foot X-ray: Mild to moderate hallux valgus deformity with significant deviation of the hallux and frontal plane rotation (01/02/2024).   Assessment:   1. Hallux valgus with bunions of left foot   2. Fissure in skin of right foot      Plan:  Patient was evaluated and treated and all questions answered.  Assessment and Plan Assessment & Plan Bunion, left foot hallux valgus deformity on the left foot with pain at the medial eminence and dorsal MTP joint. No evidence arthritic changes s on x-ray. Symptoms likely due to nerve pressure from bone deformity and shoe compression. Differential diagnosis included gout, but symptoms and x-ray findings are not consistent with gout. - Recommend shoe changes to avoid tightness and pressure on the bunion. - Advise use of bunion shield with spacer to reduce pressure on the bunion. - Suggest taking Motrin for pain control as needed. - Discuss minimally invasive bunionectomy as a surgical option if conservative measures fail. - Schedule surgery for after Thanksgiving, she will take off work for the month of December as a Runner, broadcasting/film/video and then return to work with protected weightbearing and possibly a knee scooter -We discussed the risk benefits and potential complications including the risk of bone healing skin  healing neurovascular issues and other complications.  Cracked skin on right hallux Chronic fissure on the plantar medial right hallux sulcus, occasionally cracking but not currently open. Likely a hereditary or developmental issue. Symptoms include pulling sensation. No signs of infection or ulceration. - Prescribe topical steroid cream for application. - Recommend  over-the-counter urea cream for exfoliation. - Advise using a pumice stone or file after showering to manage callus. - Instruct to alternate use of urea cream and steroid cream, one in the morning and one in the evening.   Surgical plan:  Procedure: - Left MIS bunionectomy  Location: - [facility]  Anesthesia plan: - Sedation with regional block  Postoperative pain plan: - Tylenol 1000 mg every 6 hours, ibuprofen 600 mg every 6 hours, gabapentin  300 mg every 8 hours x5 days, oxycodone 5 mg 1-2 tabs every 6 hours only as needed  DVT prophylaxis: - None required  WB Restrictions / DME needs: - WBAT in CAM boot postop     No follow-ups on file.

## 2024-01-07 ENCOUNTER — Ambulatory Visit: Admitting: Nurse Practitioner

## 2024-01-07 ENCOUNTER — Encounter: Payer: Self-pay | Admitting: Nurse Practitioner

## 2024-01-07 VITALS — BP 121/83 | HR 75 | Temp 98.3°F | Wt 162.2 lb

## 2024-01-07 DIAGNOSIS — G43909 Migraine, unspecified, not intractable, without status migrainosus: Secondary | ICD-10-CM | POA: Diagnosis not present

## 2024-01-07 DIAGNOSIS — F419 Anxiety disorder, unspecified: Secondary | ICD-10-CM | POA: Diagnosis not present

## 2024-01-07 DIAGNOSIS — E782 Mixed hyperlipidemia: Secondary | ICD-10-CM

## 2024-01-07 DIAGNOSIS — F330 Major depressive disorder, recurrent, mild: Secondary | ICD-10-CM

## 2024-01-07 MED ORDER — RIZATRIPTAN BENZOATE 10 MG PO TABS: ORAL_TABLET | ORAL | 0 refills | Status: DC

## 2024-01-07 MED ORDER — ELETRIPTAN HYDROBROMIDE 40 MG PO TABS: ORAL_TABLET | ORAL | 2 refills | Status: DC

## 2024-01-07 MED ORDER — PROMETHAZINE HCL 25 MG PO TABS: 25.0000 mg | ORAL_TABLET | Freq: Three times a day (TID) | ORAL | 1 refills | Status: DC | PRN

## 2024-01-07 MED ORDER — VENLAFAXINE HCL 37.5 MG PO TABS: ORAL_TABLET | ORAL | 1 refills | Status: DC

## 2024-01-07 NOTE — Assessment & Plan Note (Signed)
 Labs ordered at visit today.  Will make recommendations based on lab results.

## 2024-01-07 NOTE — Assessment & Plan Note (Signed)
 Chronic.  Controlled.  Continue with current medication regimen of Effexor  37.5mg  daily.  Refills sent today.  Labs ordered today.  Return to clinic in 6 months for reevaluation.  Call sooner if concerns arise.

## 2024-01-07 NOTE — Progress Notes (Signed)
 BP 121/83   Pulse 75   Temp 98.3 F (36.8 C) (Oral)   Wt 162 lb 3.2 oz (73.6 kg)   LMP 01/07/2024   SpO2 98%   BMI 25.79 kg/m    Subjective:    Patient ID: Ruth Hammond, female    DOB: 07-24-1970, 53 y.o.   MRN: [id number]  HPI: Ruth Hammond is a 53 y.o. female  Chief Complaint  Patient presents with   Medication Refill   HYPERLIPIDEMIA Hyperlipidemia status: excellent compliance Satisfied with current treatment?  no Side effects:  no Medication compliance: excellent compliance Past cholesterol meds: none Supplements: none Aspirin:  no The 10-year ASCVD risk score (Arnett DK, et al., 2019) is: 1.8%   Values used to calculate the score:     Age: 44 years     Clincally relevant sex: Female     Is Non-Hispanic African American: No     Diabetic: No     Tobacco smoker: No     Systolic Blood Pressure: 121 mmHg     Is BP treated: No     HDL Cholesterol: 48 mg/dL     Total Cholesterol: 195 mg/dL Chest pain:  no Coronary artery disease:  no Family history CAD:  no Family history early CAD:  no  MIGRAINES Patient states her migraines are well controlled.  She doesn't feel like her migraines are as frequent as they have been. She feels like she is doing okay with out it.   MOOD She feels like her mood is doing well.  Feels like the Effexor  is working well for her.  Denies concerns at visit today.  Feels like this is a good dose for her.   Flowsheet Row Office Visit from 01/07/2024 in [facility]  PHQ-9 Total Score 0      01/07/2024    1:05 PM 12/17/2023    8:48 AM 04/20/2022    2:30 PM 01/12/2022   11:19 AM  GAD 7 : Generalized Anxiety Score  Nervous, Anxious, on Edge 0 0 0 0  Control/stop worrying 0 0 0 0  Worry too much - different things 0 0 0 0  Trouble relaxing 0 0 0 0  Restless 0 0 0 0  Easily annoyed or irritable 0 0 0 0  Afraid - awful might happen 0 0 0 0  Total GAD 7 Score 0 0 0 0  Anxiety Difficulty Not difficult at all  Not difficult at all Not difficult at all Not difficult at all     WEIGHT GAIN Patient stats she has noticed weight gain that started in the summer. Feels like she wasn't watching what she was eating as closely as she was before.  She was increasing her exercise and was eating one larger meal and smaller meals during the day.    Relevant past medical, surgical, family and social history reviewed and updated as indicated. Interim medical history since our last visit reviewed. Allergies and medications reviewed and updated.  Review of Systems  Constitutional:  Positive for unexpected weight change.  Neurological:  Positive for headaches.  Psychiatric/Behavioral:  Positive for dysphoric mood. Negative for suicidal ideas. The patient is nervous/anxious.     Per HPI unless specifically indicated above     Objective:    BP 121/83   Pulse 75   Temp 98.3 F (36.8 C) (Oral)   Wt 162 lb 3.2 oz (73.6 kg)   LMP 01/07/2024   SpO2 98%   BMI  25.79 kg/m   Wt Readings from Last 3 Encounters:  01/07/24 162 lb 3.2 oz (73.6 kg)  01/02/24 163 lb (73.9 kg)  12/17/23 163 lb (73.9 kg)    Physical Exam Vitals and nursing note reviewed.  Constitutional:      General: She is not in acute distress.    Appearance: Normal appearance. She is normal weight. She is not ill-appearing, toxic-appearing or diaphoretic.  HENT:     Head: Normocephalic.     Right Ear: External ear normal.     Left Ear: External ear normal.     Nose: Nose normal.     Mouth/Throat:     Mouth: Mucous membranes are moist.     Pharynx: Oropharynx is clear.  Eyes:     General:        Right eye: No discharge.        Left eye: No discharge.     Extraocular Movements: Extraocular movements intact.     Conjunctiva/sclera: Conjunctivae normal.     Pupils: Pupils are equal, round, and reactive to light.  Cardiovascular:     Rate and Rhythm: Normal rate and regular rhythm.     Heart sounds: No murmur heard. Pulmonary:      Effort: Pulmonary effort is normal. No respiratory distress.     Breath sounds: Normal breath sounds. No wheezing or rales.  Musculoskeletal:     Cervical back: Normal range of motion and neck supple.  Skin:    General: Skin is warm and dry.     Capillary Refill: Capillary refill takes less than 2 seconds.  Neurological:     General: No focal deficit present.     Mental Status: She is alert and oriented to person, place, and time. Mental status is at baseline.  Psychiatric:        Mood and Affect: Mood normal.        Behavior: Behavior normal.        Thought Content: Thought content normal.        Judgment: Judgment normal.     Results for orders placed or performed in visit on 05/11/23  HM MAMMOGRAPHY   Collection Time: 09/18/22  3:25 PM  Result Value Ref Range   HM Mammogram 0-4 Bi-Rad 0-4 Bi-Rad, Self Reported Normal      Assessment & Plan:   Problem List Items Addressed This Visit       Cardiovascular and Mediastinum   Migraine   Chronic.  Controlled.  Continue with current medication regimen on regimen of PRN Relpax  and Maxalt .   Topamax  did not work for her in the past.  Refills sent today.  Labs ordered today.  Return to clinic in 6 months for reevaluation.  Call sooner if concerns arise.        Relevant Medications   venlafaxine  (EFFEXOR ) 37.5 MG tablet   eletriptan  (RELPAX ) 40 MG tablet   rizatriptan  (MAXALT ) 10 MG tablet   Other Relevant Orders   Comp Met (CMET)     Other   Anxiety   Chronic.  Controlled.  Continue with current medication regimen of Effexor  37.5mg  daily.  Refills sent today.  Labs ordered today.  Return to clinic in 6 months for reevaluation.  Call sooner if concerns arise.       Relevant Medications   venlafaxine  (EFFEXOR ) 37.5 MG tablet   Other Relevant Orders   Comp Met (CMET)   Depression - Primary   Chronic.  Controlled.  Continue with current medication regimen of  Effexor  37.5mg  daily.  Refills sent today.  Labs ordered today.   Return to clinic in 6 months for reevaluation.  Call sooner if concerns arise.        Relevant Medications   venlafaxine  (EFFEXOR ) 37.5 MG tablet   Other Relevant Orders   Comp Met (CMET)   Mixed hyperlipidemia   Labs ordered at visit today.  Will make recommendations based on lab results.       Relevant Orders   Lipid panel     Follow up plan: Return in about 6 months (around 07/09/2024) for Physical and Fasting labs.

## 2024-01-07 NOTE — Assessment & Plan Note (Signed)
 Chronic.  Controlled.  Continue with current medication regimen on regimen of PRN Relpax  and Maxalt .   Topamax  did not work for her in the past.  Refills sent today.  Labs ordered today.  Return to clinic in 6 months for reevaluation.  Call sooner if concerns arise.

## 2024-01-08 ENCOUNTER — Ambulatory Visit: Payer: Self-pay | Admitting: Nurse Practitioner

## 2024-01-08 LAB — COMPREHENSIVE METABOLIC PANEL WITH GFR
ALT: 12 IU/L (ref 0–32)
AST: 17 IU/L (ref 0–40)
Albumin: 4.3 g/dL (ref 3.8–4.9)
Alkaline Phosphatase: 53 IU/L (ref 44–121)
BUN/Creatinine Ratio: 26 — ABNORMAL HIGH (ref 9–23)
BUN: 21 mg/dL (ref 6–24)
Bilirubin Total: <0.2 mg/dL (ref 0.0–1.2)
CO2: 23 mmol/L (ref 20–29)
Calcium: 9.7 mg/dL (ref 8.7–10.2)
Chloride: 102 mmol/L (ref 96–106)
Creatinine, Ser: 0.81 mg/dL (ref 0.57–1.00)
Globulin, Total: 2.2 g/dL (ref 1.5–4.5)
Glucose: 88 mg/dL (ref 70–99)
Potassium: 4.4 mmol/L (ref 3.5–5.2)
Sodium: 137 mmol/L (ref 134–144)
Total Protein: 6.5 g/dL (ref 6.0–8.5)
eGFR: 86 mL/min/1.73 (ref >59)

## 2024-01-08 LAB — LIPID PANEL
Chol/HDL Ratio: 3.5 ratio (ref 0.0–4.4)
Cholesterol, Total: 188 mg/dL (ref 100–199)
HDL: 53 mg/dL (ref >39)
LDL Chol Calc (NIH): 120 mg/dL — ABNORMAL HIGH (ref 0–99)
Triglycerides: 79 mg/dL (ref 0–149)
VLDL Cholesterol Cal: 15 mg/dL (ref 5–40)

## 2024-02-08 ENCOUNTER — Telehealth: Payer: Self-pay | Admitting: Podiatry

## 2024-02-08 DIAGNOSIS — Z0271 Encounter for disability determination: Secondary | ICD-10-CM

## 2024-02-08 NOTE — Telephone Encounter (Signed)
 See notes.

## 2024-02-08 NOTE — Telephone Encounter (Signed)
 Recd Leave forms for pt. Faxed [phone] notes and form. 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

## 2024-02-18 ENCOUNTER — Ambulatory Visit

## 2024-03-20 ENCOUNTER — Other Ambulatory Visit: Payer: Self-pay | Admitting: Nurse Practitioner

## 2024-03-20 NOTE — Telephone Encounter (Signed)
 Requested Prescriptions  Pending Prescriptions Disp Refills   rizatriptan  (MAXALT ) 10 MG tablet [Pharmacy Med Name: RIZATRIPTAN  10MG  TABLETS] 20 tablet 0    Sig: TAKE 1 TABLET BY MOUTH 1 TIME AS NEEDED FOR MIGRAINE. MAY REPEAT DOSE IN 2 HOURS AS NEEDED AS DIRECTED     Neurology:  Migraine Therapy - Triptan Passed - 03/20/2024 11:47 AM      Passed - Last BP in normal range    BP Readings from Last 1 Encounters:  01/07/24 121/83         Passed - Valid encounter within last 12 months    Recent Outpatient Visits           2 months ago Mild episode of recurrent major depressive disorder   [facility] Melvin Pao, NP   3 months ago Great toe pain, left    [facility] Melvin Pao, NP

## 2024-03-22 NOTE — Patient Instructions (Incomplete)
 Chronic Knee Pain, Adult Knee pain that lasts longer than 3 months is called chronic knee pain. You may have pain in one or both knees. Symptoms of chronic knee pain may also include swelling and stiffness. Many conditions can cause chronic knee pain. The most common cause is wear and tear of your knee joint as you get older. Other possible causes include: A disease that causes inflammation of the knee, such as rheumatoid arthritis. This usually affects both knees. A condition called inflammatory arthritis, such as gout. An injury to the knee that causes arthritis. An injury to the knee that damages the ligaments. Ligaments are tissues that connect bones to each other. Runner's knee or pain behind the kneecap. Treatment for chronic knee pain depends on the cause. The main treatments for chronic knee pain are: Doing exercises to help your knee move better and get stronger, called physical therapy. Losing weight if you are overweight. This condition may also be treated with medicines, injections, a knee sleeve or brace, and by using crutches. You health care provider may also recommend rest, ice, pressure (compression), and elevation, also called RICE therapy. Follow these instructions at home: If you have a knee sleeve or brace that can be taken off:  Wear the knee sleeve or brace as told by your provider. Take it off only if your provider says that you can. Check the skin around it every day. Tell your provider if you see problems. Loosen the knee sleeve or brace if your toes tingle, are numb, or turn cold and blue. Keep the knee sleeve or brace clean and dry. Bathing If the knee sleeve or brace is not waterproof: Do not let it get wet. Cover it when you take a bath or shower. Use a cover that does not let any water in. Managing pain, stiffness, and swelling     If told, put heat on the area. Do this as often as told. Use the heat source that your provider recommends, such as a moist  heat pack or a heating pad. If you have a knee sleeve or brace that you can take off, remove it as told. Place a towel between your skin and the heat source. Leave the heat on for 20-30 minutes. If told, put ice on the area. If you have a knee sleeve or brace that you can take off, remove it as told. Put ice in a plastic bag. Place a towel between your skin and the bag. Leave the ice on for 20 minutes, 2-3 times a day. If your skin turns bright red, remove the ice or heat right away to prevent skin damage. The risk of damage is higher if you cannot feel pain, heat, or cold. Move your toes often to reduce stiffness and swelling. Raise the injured area above the level of your heart while you are sitting or lying down. Use a pillow to support your foot as needed. Activity Avoid activities where both feet leave the ground at the same time. Avoid running, jumping rope, and doing jumping jacks. Follow the exercise plan that your provider made for you. Your provider may suggest that you: Avoid activities that make knee pain worse. This may mean that you need to change your exercise routines, sports, or job duties. Wear shoes with cushioned soles. Avoid sports that require running and sudden changes in direction. Do physical therapy. Physical therapy helps your knee move better and get stronger. Exercise as told. Do exercises that increase balance and strength, such as  tai chi and yoga. Do not stand or walk on your injured knee until you're told it's okay. Use crutches as told. Return to normal activities when you're told. Ask what things are safe for you to do. General instructions Take your medicines only as told by your provider. If you are overweight, work with your provider and an expert in healthy eating called a dietitian to set goals to lose weight. Losing even a little weight can reduce knee pain. Being overweight can make your knee hurt more. Do not smoke, vape, or use products with  nicotine or tobacco in them. If you need help quitting, talk with your provider. Keep all follow-up visits. Your provider will monitor your pain and try other treatments if needed. Contact a health care provider if: You have knee pain that is not getting better or gets worse. You are not able to do your exercises due to knee pain. Get help right away if: Your knee swells and the swelling becomes worse. You cannot move your knee. You have severe knee pain. This information is not intended to replace advice given to you by your health care provider. Make sure you discuss any questions you have with your health care provider. Document Revised: 03/08/2023 Document Reviewed: 07/31/2022 Elsevier Patient Education  2024 [employer].

## 2024-03-24 ENCOUNTER — Ambulatory Visit: Admitting: Nurse Practitioner

## 2024-03-25 ENCOUNTER — Ambulatory Visit
Admission: RE | Admit: 2024-03-25 | Discharge: 2024-03-25 | Disposition: A | Discharge status: Home / Self Care | Source: Ambulatory Visit | Attending: Nurse Practitioner | Admitting: Nurse Practitioner

## 2024-03-25 ENCOUNTER — Ambulatory Visit: Admitting: Nurse Practitioner

## 2024-03-25 ENCOUNTER — Encounter: Payer: Self-pay | Admitting: Nurse Practitioner

## 2024-03-25 VITALS — BP 111/76 | HR 80 | Temp 98.1°F | Ht 66.5 in | Wt 168.4 lb

## 2024-03-25 DIAGNOSIS — M25561 Pain in right knee: Secondary | ICD-10-CM

## 2024-03-25 DIAGNOSIS — Z23 Encounter for immunization: Secondary | ICD-10-CM

## 2024-03-25 MED ORDER — DICLOFENAC SODIUM 1 % EX GEL: 4.0000 g | Freq: Four times a day (QID) | CUTANEOUS | 1 refills | Status: AC

## 2024-03-25 NOTE — Progress Notes (Signed)
 BP 111/76   Pulse 80   Temp 98.1 F (36.7 C) (Oral)   Ht 5' 6.5 (1.689 m)   Wt 76.4 kg   SpO2 98%   BMI 26.77 kg/m    Subjective:    Patient ID: Ruth Hammond, female    DOB: 05/20/1971, 53 y.o.   MRN: [id number]  NOTE WRITTEN BY DNP STUDENT.  ASSESSMENT AND PLAN OF CARE REVIEWED WITH STUDENT, AGREE WITH ABOVE FINDINGS AND PLAN.  Chief Complaint  Patient presents with   Knee Pain    Patient states she has been experiencing tightness in her R knee for the last 2 years. States for the last few months she has noticed an increase in this feeling as well as pain in the knee. States she mainly feels this with walking or moving the joint.    HPI: Ruth Hammond is a 53 y.o. female presents today for an acute visit for tightness and decreased flexibility in her right knee joint that has been ongoing for a couple years. Pt recently started teaching again and noticed pain in her right knee joint that has been worsening over the last few months with the increased activity. Pt states it has been limiting her physical activity. Pt has had minimal relief with ibuprofen and heat pads. Pt denies injury, swelling, or weakness.   KNEE PAIN Duration: months Involved knee: right Mechanism of injury: unknown Location:anterior Onset: gradual Severity: 8/10  Quality:  aching and pressure-like Frequency: a few times a year Radiation: no Aggravating factors: walking, running, stairs, bending, movement, and prolonged sitting  Alleviating factors: Heat packs and ibuprofen Status: stable Treatments attempted: rest, heat, and ibuprofen  Relief with NSAIDs?:  mild Weakness with weight bearing or walking: no Sensation of giving way: no Locking: no Popping: no Bruising: no Swelling: no Redness: no Paresthesias/decreased sensation: no Fevers: no  Relevant past medical, surgical, family and social history reviewed and updated as indicated. Interim medical history since our last visit  reviewed. Allergies and medications reviewed and updated.  Review of Systems  Constitutional:  Negative for fever.  Respiratory:  Negative for chest tightness and shortness of breath.   Cardiovascular:  Negative for chest pain and palpitations.  Musculoskeletal:  Positive for arthralgias. Negative for back pain, gait problem and joint swelling.  Neurological:  Negative for weakness and numbness.    Per HPI unless specifically indicated above     Objective:    BP 111/76   Pulse 80   Temp 98.1 F (36.7 C) (Oral)   Ht 5' 6.5 (1.689 m)   Wt 76.4 kg   SpO2 98%   BMI 26.77 kg/m   Wt Readings from Last 3 Encounters:  03/25/24 76.4 kg  01/07/24 73.6 kg  01/02/24 73.9 kg    Physical Exam Vitals and nursing note reviewed.  Constitutional:      General: She is not in acute distress.    Appearance: She is not ill-appearing, toxic-appearing or diaphoretic.  HENT:     Head: Normocephalic.     Right Ear: External ear normal.     Left Ear: External ear normal.     Nose: Nose normal.     Mouth/Throat:     Mouth: Mucous membranes are moist.     Pharynx: No oropharyngeal exudate or posterior oropharyngeal erythema.  Eyes:     General:        Right eye: No discharge.        Left eye: No discharge.  Extraocular Movements: Extraocular movements intact.     Conjunctiva/sclera: Conjunctivae normal.     Pupils: Pupils are equal, round, and reactive to light.  Cardiovascular:     Rate and Rhythm: Normal rate and regular rhythm.     Pulses: Normal pulses.     Heart sounds: Normal heart sounds. No murmur heard. Pulmonary:     Effort: Pulmonary effort is normal. No respiratory distress.     Breath sounds: Normal breath sounds. No wheezing, rhonchi or rales.  Abdominal:     General: Abdomen is flat. Bowel sounds are normal.     Palpations: Abdomen is soft.  Musculoskeletal:        General: No swelling, deformity or signs of injury. Normal range of motion.     Cervical back: Normal  range of motion and neck supple.     Right lower leg: No swelling, deformity or bony tenderness.  Skin:    General: Skin is warm and dry.     Capillary Refill: Capillary refill takes less than 2 seconds.  Neurological:     General: No focal deficit present.     Mental Status: She is oriented to person, place, and time.  Psychiatric:        Mood and Affect: Mood normal.        Behavior: Behavior normal.        Thought Content: Thought content normal.        Judgment: Judgment normal.     Results for orders placed or performed in visit on 01/07/24  Comp Met (CMET)   Collection Time: 01/07/24  1:18 PM  Result Value Ref Range   Glucose 88 70 - 99 mg/dL   BUN 21 6 - 24 mg/dL   Creatinine, Ser 9.18 0.57 - 1.00 mg/dL   eGFR 86 >40 fO/fpw/8.26   BUN/Creatinine Ratio 26 (H) 9 - 23   Sodium 137 134 - 144 mmol/L   Potassium 4.4 3.5 - 5.2 mmol/L   Chloride 102 96 - 106 mmol/L   CO2 23 20 - 29 mmol/L   Calcium 9.7 8.7 - 10.2 mg/dL   Total Protein 6.5 6.0 - 8.5 g/dL   Albumin 4.3 3.8 - 4.9 g/dL   Globulin, Total 2.2 1.5 - 4.5 g/dL   Bilirubin Total <9.7 0.0 - 1.2 mg/dL   Alkaline Phosphatase 53 44 - 121 IU/L   AST 17 0 - 40 IU/L   ALT 12 0 - 32 IU/L  Lipid panel   Collection Time: 01/07/24  1:18 PM  Result Value Ref Range   Cholesterol, Total 188 100 - 199 mg/dL   Triglycerides 79 0 - 149 mg/dL   HDL 53 >60 mg/dL   VLDL Cholesterol Cal 15 5 - 40 mg/dL   LDL Chol Calc (NIH) 879 (H) 0 - 99 mg/dL   Chol/HDL Ratio 3.5 0.0 - 4.4 ratio      Assessment & Plan:   Problem List Items Addressed This Visit   None Visit Diagnoses       Acute pain of right knee    -  Primary   Start using voltaren gel. X-ray ordered. Discussed possibility of ortho referral pending x-ray results. Follow-up as needed if pain worsens or does not resolve.     Need for influenza vaccination       Relevant Orders   Flu vaccine trivalent PF, 6mos and older(Flulaval,Afluria,Fluarix,Fluzone) (Completed)         Follow up plan: No follow-ups on file.

## 2024-03-26 ENCOUNTER — Encounter: Payer: Self-pay | Admitting: Nurse Practitioner

## 2024-03-31 ENCOUNTER — Ambulatory Visit: Payer: Self-pay | Admitting: Nurse Practitioner

## 2024-03-31 DIAGNOSIS — M25561 Pain in right knee: Secondary | ICD-10-CM

## 2024-04-04 ENCOUNTER — Ambulatory Visit (INDEPENDENT_AMBULATORY_CARE_PROVIDER_SITE_OTHER)

## 2024-04-04 DIAGNOSIS — M1711 Unilateral primary osteoarthritis, right knee: Secondary | ICD-10-CM

## 2024-04-04 DIAGNOSIS — M76899 Other specified enthesopathies of unspecified lower limb, excluding foot: Secondary | ICD-10-CM | POA: Diagnosis not present

## 2024-04-04 MED ORDER — MELOXICAM 15 MG PO TABS: 15.0000 mg | ORAL_TABLET | Freq: Every day | ORAL | 0 refills | Status: DC

## 2024-04-04 MED ORDER — TRIAMCINOLONE ACETONIDE 40 MG/ML IJ SUSP
40.0000 mg | INTRAMUSCULAR | Status: AC | PRN
  Administered 2024-04-04: 40 mg via INTRA_ARTICULAR

## 2024-04-04 NOTE — Progress Notes (Signed)
 Orthopaedic Surgery New Patient Visit   History of Present Illness: The patient is a 53 y.o. female seen in clinic for 2-year history of right knee pain.  Describes as an ache/pressure.  Associated stiffness; most severe at the end of an active day.  Gradually worsening over the past few months (since returning to school; works as an Tourist information centre manager).  Pain exacerbated with walking or moving the joint.  Primarily on the medial aspect.  Tightness over the posterior aspect.  Minimal improvement with over-the-counter ibuprofen and heating pads.  Patient can no longer tolerate sitting in a cross-legged position or using recumbent bike.  No radiation of pain.  Denies knee effusion.  Denies precipitating injury/trauma.  Denies clicking, popping, grinding. Denies catching or locking. Denies instability, buckling, giving out. Patient in late September 2025 went on a vacation to [place] which involved a significant amount of walking. Caused worsening of knee pain. Patient seen by PCP, Darice Petty NP on 03/25/2024 for right knee pain; referred to orthopedist.  Patient has performed exercises advised by [facility]; which elicited worsening pain.  Patient with no DM history. No CKD history. Last BUN 21, Cr 0.81, and eGFR of 86.  Patient with previous history of CESI, prior to ACDF, in 2017.  Patient has previously had physical therapy for pelvic floor strengthening. Had poor insurance coverage; ended up being very expensive.  Patient in June 2020 sustained fall on stairs (leg pulled out from under her, halfway down wooden basement stairs, from dogs leash). Landed hard on buttocks. Syncopal episode. Husband called 911. Patient transferred via ambulance to hospital. Developed severe low back and buttocks contusion. Patient also had sciatica (unsure which side). Managed with exercises. Patient states currently, any time there is a mild flare-up of sciatica, able to resolve within a few days of exercises.  Current knee symptoms feel different/unrelated.   Past Medical, Social and Family History: Past Medical History:  Diagnosis Date   Anxiety    Complication of anesthesia    Depression    PONV (postoperative nausea and vomiting)    Past Surgical History:  Procedure Laterality Date   COLONOSCOPY N/A 01/04/2021   Procedure: COLONOSCOPY;  Surgeon: Therisa Bi, MD;  Location: [facility];  Service: Gastroenterology;  Laterality: N/A;   DILATION AND CURETTAGE OF UTERUS     neck fusion  10/2015   TUBAL LIGATION     WISDOM TOOTH EXTRACTION     Allergies  Allergen Reactions   Bee Venom Swelling    Bee stings   Poison Ivy Extract Hives   Other Swelling    Bee stings   Current Outpatient Medications on File Prior to Visit  Medication Sig Dispense Refill   clobetasol  cream (TEMOVATE ) 0.05 % Apply 1 Application topically daily. To R foot fissure/callus 30 g 0   Cyanocobalamin (VITAMIN B 12 PO) Take by mouth daily.     diclofenac Sodium (VOLTAREN) 1 % GEL Apply 4 g topically 4 (four) times daily. 120 g 1   eletriptan  (RELPAX ) 40 MG tablet TAKE 1 TABLET BY MOUTH AS NEEDED FOR MIGRAINE OR HEADACHE. MAY REPEAT IN 2 HOURS IF HEADACHE PERSISTS OR RECURS 30 tablet 2   ibuprofen (ADVIL,MOTRIN) 100 MG/5ML suspension Take 200 mg by mouth daily.     Multiple Vitamin (MULTIVITAMIN) tablet Take 1 tablet by mouth daily.     promethazine  (PHENERGAN ) 25 MG tablet Take 1 tablet (25 mg total) by mouth every 8 (eight) hours as needed for nausea or vomiting. 40 tablet 1  Rhubarb (ESTROVEN COMPLETE PO) Take by mouth.     rizatriptan  (MAXALT ) 10 MG tablet TAKE 1 TABLET BY MOUTH 1 TIME AS NEEDED FOR MIGRAINE. MAY REPEAT DOSE IN 2 HOURS AS NEEDED AS DIRECTED 20 tablet 0   venlafaxine  (EFFEXOR ) 37.5 MG tablet TAKE 1 TABLET BY MOUTH TWICE DAILY 90 tablet 1   No current facility-administered medications on file prior to visit.   Social History   Tobacco Use   Smoking status: Never   Smokeless tobacco: Never   Vaping Use   Vaping status: Never Used  Substance Use Topics   Alcohol use: No   Drug use: No      I have reviewed past medical, surgical, social and family history, medications and allergies as documented in the EMR.  Review of Systems - A ROS was performed including pertinent positives and negatives as documented in the HPI.     Physical Exam:  General/Constitutional: NAD Vascular: No edema, swelling or tenderness, except as noted in detailed exam Integumentary: No impressive skin lesions present, except as noted in detailed exam Neuro/Psych: Normal mood and affect, oriented to person, place and time Musculoskeletal: Normal, except as noted in detailed exam and in HPI   Focused examination:  Right knee normal to inspection. Skin is intact without erythema or ecchymosis. No joint effusion. No fluid wave. Negative patellar tap test. Patellar grind test with palpable crepitus (slightly increased compared to left knee).   No gross deformity. Moderate tenderness with palpation over pes anserine/distal hamstring insertion. Pain/tightness experienced posterior about the hamstring tendons medially and laterally with the knee in full extension.  Knee Examination (focused):   RIGHT  AROM (degrees) PROM (degrees)  0-130 0-135  Palpation (pain): Effusion none   Medial joint line tenderness positive   Lateral joint line tenderness negative  Instability: Varus @ 0 degrees            @ 30 degrees none none   Valgus @ 0 degrees             @ 30 degrees none none  Special Tests: Lachman's negative   Posterior drawer none   Anterior drawer none   Pivot shift   Not performed   McMurray negative   Dial @ 30 degrees         @ 90 degrees Not performed   Patella: Palpation (pain) Very minimal   Mobility < 2 quadrants   Apprehension negative  Other: Knee flexion strength   5/5   Knee extension strength  5/5    Vascular/Lymphatic: 2+ dorsalis pedis/posterior tibialis pulses,  foot warm and well perfused Neurologic: Sensation intact to light touch to Superficial peroneal/Deep peroneal/Tibial/Sural/Saphenous nerves     XR Right Knee Imaging: X-rays of the Right knee including 4-views (AP, lateral, patella sunrise, tunnel) obtained 03/25/2024 at [facility] were reviewed personally by me.  Per my independent interpretation these images show mild medial joint space narrowing. No acute fracture. No dislocation. No soft tissue abnormalities.   Radiology Read:  Right knee x-ray FINDINGS: No evidence of fracture, dislocation, or joint effusion. Normal alignment. Trace peripheral patellar spurring. No other evidence of arthropathy or focal bone abnormality. Soft tissues are unremarkable.   IMPRESSION: Trace peripheral patellar spurring.  Large Joint Inj: R knee on 04/04/2024 2:04 PM Indications: pain Details: (21 G) 1.5 in needle, anterolateral approach Medications: 40 mg triamcinolone  acetonide 40 MG/ML (Ropivicaine 0.5% (4 cc)) Outcome: tolerated well, no immediate complications Procedure, treatment alternatives, risks and benefits  explained, specific risks discussed. Consent was given by the patient. Patient was prepped and draped in the usual sterile fashion.       Assessment:  Right knee osteoarthritis Right knee distal hamstring tendonitis Right knee pes anserine bursitis  Plan:  Patient was seen and examined in office today. We reviewed patient's history, examination, and imaging in detail. Based on information available for this encounter, patient with 1-1/2-year history of right knee pain likely secondary to osteoarthritis (primarily medial joint space), distal hamstring tendinitis, pes anserine bursitis.  Radiographic imaging reveals patellofemoral and medial joint early arthritic changes of osteophyte formation and joint space loss respectively. Patient with point tenderness over pes anserine and along medial joint line.  Patient also with significant hamstring tightness, pain elicited with full knee extension. Patient has failed initial conservative treatment of time, rest, ice/heat, over-the-counter NSAIDs.  Discussed next treatment steps including referral for formal course of physical therapy.  Patient has agreed with plans for quick transition to HEP.  Patient also offered intra-articular corticosteroid injection today.  Discussed risks versus benefits.  Patient requested injection.  Tolerated procedure well. Patient also prescribed prescription Meloxicam- advised not to take other NSAIDs while taking this medication. Patient to follow up in clinic in 6-8 weeks for repeat evaluation.   All questions, concerns and comments were addressed to the best of my ability.  Follow-up: 6 weeks. Sooner with any new/worsening symptoms or concerns.  Procedure: The risks and benefits of a cortisone injection were discussed and the patient wishes to proceed.  The lateral compartment of the right knee was cleansed with alcohol swabs and ChloraPrep.  The skin was flash cooled with Ethyl Chloride, and using sterile technique, a 21 gauge needle was immediately introduced into the joint.  After aspiration revealed a flash of clear yellow tinged joint fluid the injectate which consisted of 1cc of 40 mg/mL of Kenalog  and 4cc of 0.5% ropivacaine easily flowed into the joint.  The needle was withdrawn and pressure was applied for hemostasis.  A bandage was applied.  The patient tolerated the procedure well. Patient instructed to ice the area tonight if sore.  Cortisone Injections You have received a cortisone injection today. This injection consists of a numbing medication and cortisone.  There may be side effects after receiving the injection.   If you are diabetic: Your blood sugar may increase for up to 48 hours.  Check it more often than normal.   General reactions: A cortisone flare reaction. This generally occurs 6-8 hours after  receiving the injection.  Ice the area and take Tylenol for pain.  If the pain lasts longer than 48 hours call the office.   Some patients may experience flushing, increased heart rate, red face or increase in body temperature.  This is rare but can happen.   Over the counter Benadryl, if appropriate, often reduces these symptoms.  For a severe reaction contact your family doctor or go to the emergency room.  If you have any other questions, feel free to ask.   Arlyss GEANNIE Schneider, DO Orthopedic Surgery & Sports Medicine [place]   This document was dictated using Dragon voice recognition software. A reasonable attempt at proof reading has been made to minimize errors.

## 2024-04-17 ENCOUNTER — Encounter: Payer: Self-pay | Admitting: Nurse Practitioner

## 2024-04-17 ENCOUNTER — Telehealth: Payer: Self-pay | Admitting: Podiatry

## 2024-04-17 NOTE — Telephone Encounter (Signed)
 DOS- 05/23/2024  DOUBLE OSTEOTOMY 1 LT- 28299  [employer] EFFECTIVE DATE- 06/20/2023  DEDUCTIBLE-  $1250 REMAINING- $1250 OOP- $4890 REMAINING- $4365.18 COINSURANCE- 20%  PER AVAILITY PORTAL, NO PRIOR AUTH IS REQUIRED FOR CPT CODE 71700. DOCUMENTATION ATTACHED TO SURGERY CONSENT PACKET.

## 2024-04-21 ENCOUNTER — Encounter: Payer: Self-pay | Admitting: Radiology

## 2024-04-22 ENCOUNTER — Other Ambulatory Visit: Payer: Self-pay | Admitting: Nurse Practitioner

## 2024-04-24 ENCOUNTER — Encounter: Payer: Self-pay | Admitting: Nurse Practitioner

## 2024-04-24 NOTE — Telephone Encounter (Signed)
 Requested medication (s) are due for refill today: na   Requested medication (s) are on the active medication list: yes   Last refill:  01/07/24 #90 1 refill  Future visit scheduled: no   Notes to clinic:  do you want to refill Rx for #180?     Requested Prescriptions  Pending Prescriptions Disp Refills   venlafaxine  (EFFEXOR ) 37.5 MG tablet [Pharmacy Med Name: VENLAFAXINE  37.5MG  TABLETS] 90 tablet 1    Sig: TAKE 1 TABLET BY MOUTH TWICE DAILY     Psychiatry: Antidepressants - SNRI - desvenlafaxine & venlafaxine  Failed - 04/24/2024  1:57 PM      Failed - Lipid Panel in normal range within the last 12 months    Cholesterol, Total  Date Value Ref Range Status  01/07/2024 188 100 - 199 mg/dL Final   LDL Chol Calc (NIH)  Date Value Ref Range Status  01/07/2024 120 (H) 0 - 99 mg/dL Final   HDL  Date Value Ref Range Status  01/07/2024 53 >39 mg/dL Final   Triglycerides  Date Value Ref Range Status  01/07/2024 79 0 - 149 mg/dL Final         Passed - Cr in normal range and within 360 days    Creatinine, Ser  Date Value Ref Range Status  01/07/2024 0.81 0.57 - 1.00 mg/dL Final         Passed - Completed PHQ-2 or PHQ-9 in the last 360 days      Passed - Last BP in normal range    BP Readings from Last 1 Encounters:  03/25/24 111/76         Passed - Valid encounter within last 6 months    Recent Outpatient Visits           1 month ago Acute pain of right knee   [facility] Melvin Pao, NP   3 months ago Mild episode of recurrent major depressive disorder   [place] [facility] Melvin Pao, NP   4 months ago Great toe pain, left   [place] [facility] Melvin Pao, NP       Future Appointments             In 2 months Gust Molly, DO [facility]

## 2024-04-25 MED ORDER — VENLAFAXINE HCL 37.5 MG PO TABS: ORAL_TABLET | ORAL | 1 refills | Status: DC

## 2024-05-07 ENCOUNTER — Ambulatory Visit: Admitting: Nurse Practitioner

## 2024-05-22 ENCOUNTER — Other Ambulatory Visit: Payer: Self-pay | Admitting: Podiatry

## 2024-05-22 MED ORDER — ACETAMINOPHEN 500 MG PO TABS: 1000.0000 mg | ORAL_TABLET | Freq: Four times a day (QID) | ORAL | 0 refills | Status: AC | PRN

## 2024-05-22 MED ORDER — GABAPENTIN 300 MG PO CAPS: 300.0000 mg | ORAL_CAPSULE | Freq: Three times a day (TID) | ORAL | 0 refills | Status: DC

## 2024-05-22 MED ORDER — IBUPROFEN 600 MG PO TABS: 600.0000 mg | ORAL_TABLET | Freq: Four times a day (QID) | ORAL | 0 refills | Status: AC | PRN

## 2024-05-22 MED ORDER — OXYCODONE HCL 5 MG PO TABS: 5.0000 mg | ORAL_TABLET | ORAL | 0 refills | Status: AC | PRN

## 2024-05-23 DIAGNOSIS — M2012 Hallux valgus (acquired), left foot: Secondary | ICD-10-CM | POA: Diagnosis not present

## 2024-05-28 ENCOUNTER — Ambulatory Visit (INDEPENDENT_AMBULATORY_CARE_PROVIDER_SITE_OTHER)

## 2024-05-28 ENCOUNTER — Ambulatory Visit: Admitting: Podiatry

## 2024-05-28 VITALS — Ht 66.5 in | Wt 168.4 lb

## 2024-05-28 DIAGNOSIS — M2012 Hallux valgus (acquired), left foot: Secondary | ICD-10-CM

## 2024-05-28 DIAGNOSIS — M21612 Bunion of left foot: Secondary | ICD-10-CM | POA: Diagnosis not present

## 2024-05-28 NOTE — Progress Notes (Signed)
°  Subjective:  Patient ID: Ruth Hammond, female    DOB: 01/23/1971,  MRN: [id number]  Chief Complaint  Patient presents with   Post-op Follow-up    RM 8 POV #1 DOS 05/23/2024 LT 1ST DOUBLE OSTEOTOMY(Alsha Meland PATIENT). Surgical site is healing well, all sutures are intact with minor bruising and swelling.     53 y.o. female returns for post-op check.  Overall doing well  Review of Systems: Negative except as noted in the HPI. Denies N/V/F/Ch.   Objective:  There were no vitals filed for this visit. Body mass index is 26.77 kg/m. Constitutional Well developed. Well nourished.  Vascular Foot warm and well perfused. Capillary refill normal to all digits.  Calf is soft and supple, no posterior calf or knee pain, negative Homans' sign  Neurologic Normal speech. Oriented to person, place, and time. Epicritic sensation to light touch grossly present bilaterally.  Dermatologic Skin healing well without signs of infection. Skin edges well coapted without signs of infection.  Orthopedic: Tenderness to palpation noted about the surgical site.   Multiple view plain film radiographs: Adequate correction noted, screws intact and in equivalent position to immediate postoperative films Assessment:   1. Hallux valgus with bunions of left foot    Plan:  Patient was evaluated and treated and all questions answered.  S/p foot surgery left -Progressing as expected post-operatively. -XR: Noted above no complications -WB Status: Weightbearing as tolerated in cam walker boot  -Return in 2 weeks to remove sutures -May begin bathing on Monday, recommend immediate range of motion of MTP joint active and manual passive range of motion  No follow-ups on file.

## 2024-05-29 ENCOUNTER — Encounter

## 2024-06-02 ENCOUNTER — Encounter: Payer: Self-pay | Admitting: Podiatry

## 2024-06-03 ENCOUNTER — Encounter: Payer: Self-pay | Admitting: Nurse Practitioner

## 2024-06-03 ENCOUNTER — Ambulatory Visit: Admitting: Nurse Practitioner

## 2024-06-03 VITALS — BP 113/82 | HR 83 | Temp 97.8°F | Ht 66.0 in | Wt 173.2 lb

## 2024-06-03 DIAGNOSIS — E782 Mixed hyperlipidemia: Secondary | ICD-10-CM

## 2024-06-03 DIAGNOSIS — Z0000 Encounter for general adult medical examination without abnormal findings: Secondary | ICD-10-CM

## 2024-06-03 DIAGNOSIS — F330 Major depressive disorder, recurrent, mild: Secondary | ICD-10-CM

## 2024-06-03 DIAGNOSIS — F419 Anxiety disorder, unspecified: Secondary | ICD-10-CM

## 2024-06-03 DIAGNOSIS — G43909 Migraine, unspecified, not intractable, without status migrainosus: Secondary | ICD-10-CM | POA: Diagnosis not present

## 2024-06-03 MED ORDER — ELETRIPTAN HYDROBROMIDE 40 MG PO TABS: ORAL_TABLET | ORAL | 2 refills | Status: AC

## 2024-06-03 MED ORDER — PROMETHAZINE HCL 25 MG PO TABS: 25.0000 mg | ORAL_TABLET | Freq: Three times a day (TID) | ORAL | 1 refills | Status: AC | PRN

## 2024-06-03 MED ORDER — RIZATRIPTAN BENZOATE 10 MG PO TABS: ORAL_TABLET | ORAL | 3 refills | Status: AC

## 2024-06-03 MED ORDER — VENLAFAXINE HCL 37.5 MG PO TABS: ORAL_TABLET | ORAL | 1 refills | Status: AC

## 2024-06-03 NOTE — Progress Notes (Signed)
 BP 113/82 (BP Location: Left Arm, Patient Position: Sitting)   Pulse 83   Temp 97.8 F (36.6 C) (Oral)   Ht 1' 8.27 (0.515 m)   Wt 173 lb 3.2 oz (78.6 kg)   LMP 03/23/2024 (Exact Date)   SpO2 99%   BMI 296.43 kg/m    Subjective:    Patient ID: Ruth Hammond, female    DOB: 1970-11-14, 53 y.o.   MRN: [id number]  HPI: Ruth Hammond is a 53 y.o. female presenting on 06/03/2024 for comprehensive medical examination. Current medical complaints include:none  She currently lives with: Menopausal Symptoms: no  MOOD Patient states her mood has been good.  She feels like the venlafaxine  37.5mg  is working well for her.  Denies concerns at visit today.  Effexor  is working well for her. Denies SI.    MIGRAINES Feels like the migraines are manageable.  She is getting <10 migranes per month.  Not using a whole relpax  prescription each month.    Patient had a Bunionectomy with Podiatry.  She is on the up now from the surgery.     Depression Screen done today and results listed below:     06/03/2024    3:43 PM 01/07/2024    1:05 PM 12/17/2023    8:48 AM 04/20/2022    2:30 PM 01/12/2022   11:19 AM  Depression screen PHQ 2/9  Decreased Interest 0 0 0 0 0  Down, Depressed, Hopeless 0 0 0 0 0  PHQ - 2 Score 0 0 0 0 0  Altered sleeping 0 0 0 0 0  Tired, decreased energy 0 0 0 0 0  Change in appetite 0 0 0 0 0  Feeling bad or failure about yourself  0 0 0 0 0  Trouble concentrating 0 0 0 0 0  Moving slowly or fidgety/restless 0 0 0 0 0  Suicidal thoughts 0 0 0 0 0  PHQ-9 Score 0 0  0  0  0   Difficult doing work/chores Not difficult at all Not difficult at all Not difficult at all Not difficult at all Not difficult at all     Data saved with a previous flowsheet row definition    The patient does not have a history of falls. I did complete a risk assessment for falls. A plan of care for falls was documented.   Past Medical History:  Past Medical History:  Diagnosis Date    Anxiety    Complication of anesthesia    Depression    PONV (postoperative nausea and vomiting)     Surgical History:  Past Surgical History:  Procedure Laterality Date   BUNIONECTOMY  05/23/2024   COLONOSCOPY N/A 01/04/2021   Procedure: COLONOSCOPY;  Surgeon: Therisa Bi, MD;  Location[facility] ENDOSCOPY;  Service: Gastroenterology;  Laterality: N/A;   DILATION AND CURETTAGE OF UTERUS     INJECTION KNEE Right    neck fusion  10/2015   TUBAL LIGATION     WISDOM TOOTH EXTRACTION      Medications:  Current Outpatient Medications on File Prior to Visit  Medication Sig   acetaminophen  (TYLENOL ) 500 MG tablet Take 2 tablets (1,000 mg total) by mouth every 6 (six) hours as needed for up to 14 days (pain).   clobetasol  cream (TEMOVATE ) 0.05 % Apply 1 Application topically daily. To R foot fissure/callus   Cyanocobalamin (VITAMIN B 12 PO) Take by mouth daily.   diclofenac  Sodium (VOLTAREN ) 1 % GEL Apply 4 g topically 4 (  four) times daily.   eletriptan  (RELPAX ) 40 MG tablet TAKE 1 TABLET BY MOUTH AS NEEDED FOR MIGRAINE OR HEADACHE. MAY REPEAT IN 2 HOURS IF HEADACHE PERSISTS OR RECURS   ibuprofen  (ADVIL ) 600 MG tablet Take 1 tablet (600 mg total) by mouth every 6 (six) hours as needed for up to 14 days.   ibuprofen  (ADVIL ,MOTRIN ) 100 MG/5ML suspension Take 200 mg by mouth daily.   Multiple Vitamin (MULTIVITAMIN) tablet Take 1 tablet by mouth daily.   promethazine  (PHENERGAN ) 25 MG tablet Take 1 tablet (25 mg total) by mouth every 8 (eight) hours as needed for nausea or vomiting.   Rhubarb (ESTROVEN COMPLETE PO) Take by mouth.   rizatriptan  (MAXALT ) 10 MG tablet TAKE 1 TABLET BY MOUTH 1 TIME AS NEEDED FOR MIGRAINE. MAY REPEAT DOSE IN 2 HOURS AS NEEDED AS DIRECTED   venlafaxine  (EFFEXOR ) 37.5 MG tablet TAKE 1 TABLET BY MOUTH TWICE DAILY   gabapentin  (NEURONTIN ) 300 MG capsule Take 1 capsule (300 mg total) by mouth 3 (three) times daily for 7 days. (Patient not taking: Reported on 06/03/2024)    meloxicam  (MOBIC ) 15 MG tablet Take 1 tablet (15 mg total) by mouth daily. (Patient not taking: Reported on 06/03/2024)   No current facility-administered medications on file prior to visit.    Allergies:  Allergies  Allergen Reactions   Bee Venom Swelling    Bee stings   Poison Ivy Extract Hives   Other Swelling    Bee stings    Social History:  Social History   Socioeconomic History   Marital status: Married    Spouse name: Not on file   Number of children: Not on file   Years of education: Not on file   Highest education level: Not on file  Occupational History   Not on file  Tobacco Use   Smoking status: Never   Smokeless tobacco: Never  Vaping Use   Vaping status: Never Used  Substance and Sexual Activity   Alcohol use: No   Drug use: No   Sexual activity: Not Currently  Other Topics Concern   Not on file  Social History Narrative   Not on file   Social Drivers of Health   Tobacco Use: Low Risk (06/03/2024)   Patient History    Smoking Tobacco Use: Never    Smokeless Tobacco Use: Never    Passive Exposure: Not on file  Financial Resource Strain: Not on file  Food Insecurity: Not on file  Transportation Needs: Not on file  Physical Activity: Not on file  Stress: Not on file  Social Connections: Not on file  Intimate Partner Violence: Not on file  Depression (PHQ2-9): Low Risk (06/03/2024)   Depression (PHQ2-9)    PHQ-2 Score: 0  Alcohol Screen: Not on file  Housing: Not on file  Utilities: Not on file  Health Literacy: Not on file   Social History   Tobacco Use  Smoking Status Never  Smokeless Tobacco Never   Social History   Substance and Sexual Activity  Alcohol Use No    Family History:  Family History  Problem Relation Age of Onset   Hyperlipidemia Mother    Hypertension Mother    Stroke Mother    Diabetes Mother    Heart disease Mother    Cancer Father        bone   Hyperlipidemia Sister    Hypertension Sister    Stroke  Sister    Cancer Maternal Grandmother  ovarian or cervical not sure   Breast cancer Neg Hx     Past medical history, surgical history, medications, allergies, family history and social history reviewed with patient today and changes made to appropriate areas of the chart.   Review of Systems  Neurological:  Positive for headaches.  Psychiatric/Behavioral:  Positive for depression. Negative for suicidal ideas. The patient is nervous/anxious.    All other ROS negative except what is listed above and in the HPI.      Objective:    BP 113/82 (BP Location: Left Arm, Patient Position: Sitting)   Pulse 83   Temp 97.8 F (36.6 C) (Oral)   Ht 1' 8.27 (0.515 m)   Wt 173 lb 3.2 oz (78.6 kg)   LMP 03/23/2024 (Exact Date)   SpO2 99%   BMI 296.43 kg/m   Wt Readings from Last 3 Encounters:  06/03/24 173 lb 3.2 oz (78.6 kg)  05/28/24 168 lb 6.4 oz (76.4 kg)  03/25/24 168 lb 6.4 oz (76.4 kg)    Physical Exam Vitals and nursing note reviewed.  Constitutional:      General: She is awake. She is not in acute distress.    Appearance: Normal appearance. She is well-developed and normal weight. She is not ill-appearing.  HENT:     Head: Normocephalic and atraumatic.     Right Ear: Hearing, tympanic membrane, ear canal and external ear normal. No drainage.     Left Ear: Hearing, tympanic membrane, ear canal and external ear normal. No drainage.     Nose: Nose normal.     Right Sinus: No maxillary sinus tenderness or frontal sinus tenderness.     Left Sinus: No maxillary sinus tenderness or frontal sinus tenderness.     Mouth/Throat:     Mouth: Mucous membranes are moist.     Pharynx: Oropharynx is clear. Uvula midline. No pharyngeal swelling, oropharyngeal exudate or posterior oropharyngeal erythema.  Eyes:     General: Lids are normal.        Right eye: No discharge.        Left eye: No discharge.     Extraocular Movements: Extraocular movements intact.     Conjunctiva/sclera:  Conjunctivae normal.     Pupils: Pupils are equal, round, and reactive to light.     Visual Fields: Right eye visual fields normal and left eye visual fields normal.  Neck:     Thyroid : No thyromegaly.     Vascular: No carotid bruit.     Trachea: Trachea normal.  Cardiovascular:     Rate and Rhythm: Normal rate and regular rhythm.     Heart sounds: Normal heart sounds. No murmur heard.    No gallop.  Pulmonary:     Effort: Pulmonary effort is normal. No accessory muscle usage or respiratory distress.     Breath sounds: Normal breath sounds.  Chest:  Breasts:    Right: Normal.     Left: Normal.  Abdominal:     General: Bowel sounds are normal.     Palpations: Abdomen is soft. There is no hepatomegaly or splenomegaly.     Tenderness: There is no abdominal tenderness.  Musculoskeletal:        General: Normal range of motion.     Cervical back: Normal range of motion and neck supple.     Right lower leg: No edema.     Left lower leg: No edema.  Lymphadenopathy:     Head:     Right side of head:  No submental, submandibular, tonsillar, preauricular or posterior auricular adenopathy.     Left side of head: No submental, submandibular, tonsillar, preauricular or posterior auricular adenopathy.     Cervical: No cervical adenopathy.     Upper Body:     Right upper body: No supraclavicular, axillary or pectoral adenopathy.     Left upper body: No supraclavicular, axillary or pectoral adenopathy.  Skin:    General: Skin is warm and dry.     Capillary Refill: Capillary refill takes less than 2 seconds.     Findings: No rash.  Neurological:     Mental Status: She is alert and oriented to person, place, and time.     Gait: Gait is intact.     Deep Tendon Reflexes: Reflexes are normal and symmetric.     Reflex Scores:      Brachioradialis reflexes are 2+ on the right side and 2+ on the left side.      Patellar reflexes are 2+ on the right side and 2+ on the left side. Psychiatric:         Attention and Perception: Attention normal.        Mood and Affect: Mood normal.        Speech: Speech normal.        Behavior: Behavior normal. Behavior is cooperative.        Thought Content: Thought content normal.        Judgment: Judgment normal.     Results for orders placed or performed in visit on 01/07/24  Comp Met (CMET)   Collection Time: 01/07/24  1:18 PM  Result Value Ref Range   Glucose 88 70 - 99 mg/dL   BUN 21 6 - 24 mg/dL   Creatinine, Ser 9.18 0.57 - 1.00 mg/dL   eGFR 86 >40 fO/fpw/8.26   BUN/Creatinine Ratio 26 (H) 9 - 23   Sodium 137 134 - 144 mmol/L   Potassium 4.4 3.5 - 5.2 mmol/L   Chloride 102 96 - 106 mmol/L   CO2 23 20 - 29 mmol/L   Calcium 9.7 8.7 - 10.2 mg/dL   Total Protein 6.5 6.0 - 8.5 g/dL   Albumin 4.3 3.8 - 4.9 g/dL   Globulin, Total 2.2 1.5 - 4.5 g/dL   Bilirubin Total <9.7 0.0 - 1.2 mg/dL   Alkaline Phosphatase 53 44 - 121 IU/L   AST 17 0 - 40 IU/L   ALT 12 0 - 32 IU/L  Lipid panel   Collection Time: 01/07/24  1:18 PM  Result Value Ref Range   Cholesterol, Total 188 100 - 199 mg/dL   Triglycerides 79 0 - 149 mg/dL   HDL 53 >60 mg/dL   VLDL Cholesterol Cal 15 5 - 40 mg/dL   LDL Chol Calc (NIH) 879 (H) 0 - 99 mg/dL   Chol/HDL Ratio 3.5 0.0 - 4.4 ratio      Assessment & Plan:   Problem List Items Addressed This Visit       Cardiovascular and Mediastinum   Migraine - Primary     Other   Anxiety   Depression   Mixed hyperlipidemia   Other Visit Diagnoses       Annual physical exam             Follow up plan: No follow-ups on file.   LABORATORY TESTING:  - Pap smear: pap done  IMMUNIZATIONS:   - Tdap: Tetanus vaccination status reviewed: last tetanus booster within 10 years. - Influenza: Postponed to flu  season - Pneumovax: Not applicable - Prevnar: Not applicable - COVID: Not applicable - HPV: Not applicable - Shingrix vaccine: Up to date  SCREENING: -Mammogram: Up to date  - Colonoscopy: Up to date  -  Bone Density: Not applicable  -Hearing Test: Not applicable  -Spirometry: Not applicable   PATIENT COUNSELING:   Advised to take 1 mg of folate supplement per day if capable of pregnancy.   Sexuality: Discussed sexually transmitted diseases, partner selection, use of condoms, avoidance of unintended pregnancy  and contraceptive alternatives.   Advised to avoid cigarette smoking.  I discussed with the patient that most people either abstain from alcohol or drink within safe limits (<=14/week and <=4 drinks/occasion for males, <=7/weeks and <= 3 drinks/occasion for females) and that the risk for alcohol disorders and other health effects rises proportionally with the number of drinks per week and how often a drinker exceeds daily limits.  Discussed cessation/primary prevention of drug use and availability of treatment for abuse.   Diet: Encouraged to adjust caloric intake to maintain  or achieve ideal body weight, to reduce intake of dietary saturated fat and total fat, to limit sodium intake by avoiding high sodium foods and not adding table salt, and to maintain adequate dietary potassium and calcium preferably from fresh fruits, vegetables, and low-fat dairy products.    stressed the importance of regular exercise  Injury prevention: Discussed safety belts, safety helmets, smoke detector, smoking near bedding or upholstery.   Dental health: Discussed importance of regular tooth brushing, flossing, and dental visits.    NEXT PREVENTATIVE PHYSICAL DUE IN 1 YEAR. No follow-ups on file.

## 2024-06-03 NOTE — Assessment & Plan Note (Signed)
 Chronic.  Controlled.  Continue with current medication regimen of venlafaxine  37.5mg  daily.  Refills sent today.  Labs ordered today.  Return to clinic in 6 months for reevaluation.  Call sooner if concerns arise.

## 2024-06-03 NOTE — Assessment & Plan Note (Signed)
 Chronic.  Controlled.  Continue with current medication regimen on regimen of PRN Relpax  and Maxalt .   Does not use the medications together.  Topamax  did not work for her in the past.  Refills sent today.  Labs ordered today.  Return to clinic in 6 months for reevaluation.  Call sooner if concerns arise.

## 2024-06-03 NOTE — Assessment & Plan Note (Signed)
 Chronic.  Controlled.  Continue with current medication regimen.  Labs ordered today.  Return to clinic in 6 months for reevaluation.  Call sooner if concerns arise.  ? ?

## 2024-06-04 ENCOUNTER — Ambulatory Visit: Payer: Self-pay | Admitting: Nurse Practitioner

## 2024-06-04 LAB — CBC WITH DIFFERENTIAL/PLATELET
Basophils Absolute: 0.1 x10E3/uL (ref 0.0–0.2)
Basos: 1 %
EOS (ABSOLUTE): 0.3 x10E3/uL (ref 0.0–0.4)
Eos: 6 %
Hematocrit: 39.2 % (ref 34.0–46.6)
Hemoglobin: 13.1 g/dL (ref 11.1–15.9)
Immature Grans (Abs): 0 x10E3/uL (ref 0.0–0.1)
Immature Granulocytes: 0 %
Lymphocytes Absolute: 1.4 x10E3/uL (ref 0.7–3.1)
Lymphs: 27 %
MCH: 31 pg (ref 26.6–33.0)
MCHC: 33.4 g/dL (ref 31.5–35.7)
MCV: 93 fL (ref 79–97)
Monocytes Absolute: 0.4 x10E3/uL (ref 0.1–0.9)
Monocytes: 8 %
Neutrophils Absolute: 3.1 x10E3/uL (ref 1.4–7.0)
Neutrophils: 58 %
Platelets: 292 x10E3/uL (ref 150–450)
RBC: 4.23 x10E6/uL (ref 3.77–5.28)
RDW: 13 % (ref 11.7–15.4)
WBC: 5.3 x10E3/uL (ref 3.4–10.8)

## 2024-06-04 LAB — LIPID PANEL
Chol/HDL Ratio: 4.2 ratio (ref 0.0–4.4)
Cholesterol, Total: 232 mg/dL — ABNORMAL HIGH (ref 100–199)
HDL: 55 mg/dL (ref >39)
LDL Chol Calc (NIH): 154 mg/dL — ABNORMAL HIGH (ref 0–99)
Triglycerides: 128 mg/dL (ref 0–149)
VLDL Cholesterol Cal: 23 mg/dL (ref 5–40)

## 2024-06-04 LAB — COMPREHENSIVE METABOLIC PANEL WITH GFR
ALT: 30 IU/L (ref 0–32)
AST: 19 IU/L (ref 0–40)
Albumin: 4.4 g/dL (ref 3.8–4.9)
Alkaline Phosphatase: 65 IU/L (ref 49–135)
BUN/Creatinine Ratio: 24 — ABNORMAL HIGH (ref 9–23)
BUN: 18 mg/dL (ref 6–24)
Bilirubin Total: 0.2 mg/dL (ref 0.0–1.2)
CO2: 24 mmol/L (ref 20–29)
Calcium: 9.8 mg/dL (ref 8.7–10.2)
Chloride: 104 mmol/L (ref 96–106)
Creatinine, Ser: 0.74 mg/dL (ref 0.57–1.00)
Globulin, Total: 2.2 g/dL (ref 1.5–4.5)
Glucose: 83 mg/dL (ref 70–99)
Potassium: 4.3 mmol/L (ref 3.5–5.2)
Sodium: 141 mmol/L (ref 134–144)
Total Protein: 6.6 g/dL (ref 6.0–8.5)
eGFR: 95 mL/min/1.73 (ref >59)

## 2024-06-04 LAB — TSH: TSH: 2.3 u[IU]/mL (ref 0.450–4.500)

## 2024-06-09 ENCOUNTER — Ambulatory Visit: Admitting: Podiatry

## 2024-06-09 VITALS — Ht 66.0 in | Wt 173.2 lb

## 2024-06-09 DIAGNOSIS — M21612 Bunion of left foot: Secondary | ICD-10-CM

## 2024-06-09 DIAGNOSIS — Z0279 Encounter for issue of other medical certificate: Secondary | ICD-10-CM

## 2024-06-09 DIAGNOSIS — M2012 Hallux valgus (acquired), left foot: Secondary | ICD-10-CM

## 2024-06-09 NOTE — Progress Notes (Signed)
"  °  Subjective:  Patient ID: Ruth Hammond, female    DOB: 1971/04/01,  MRN: [id number]  Chief Complaint  Patient presents with   Post-op Follow-up    RM 8 POV #2 DOS 05/23/2024 LT 1ST DOUBLE OSTEOTOMY(Zakyia Gagan PATIENT). Surgical site is healing well and all sutures are intact.     53 y.o. female returns for post-op check.  Overall doing well  Review of Systems: Negative except as noted in the HPI. Denies N/V/F/Ch.   Objective:  There were no vitals filed for this visit. Body mass index is 27.96 kg/m. Constitutional Well developed. Well nourished.  Vascular Foot warm and well perfused. Capillary refill normal to all digits.  Calf is soft and supple, no posterior calf or knee pain, negative Homans' sign  Neurologic Normal speech. Oriented to person, place, and time. Epicritic sensation to light touch grossly present bilaterally.  Dermatologic Skin healing well without signs of infection. Skin edges well coapted without signs of infection.  Orthopedic: Mild edema and minimal pain to palpation noted about the surgical site.   Multiple view plain film radiographs: Adequate correction noted, screws intact and in equivalent position to immediate postoperative films Assessment:   1. Hallux valgus with bunions of left foot    Plan:  Patient was evaluated and treated and all questions answered.  S/p foot surgery left -Progressing as expected post-operatively. -XR: Noted above no complications -WB Status: Weightbearing as tolerated in cam walker boot  -Sutures removed -Compression sleeve applied -OK to return to work as scheduled  No follow-ups on file.  "

## 2024-06-09 NOTE — Telephone Encounter (Signed)
 Faxed [facility] form/notes Per Dr. Silva she will need to wear Cam boot and will have to elevate as needed.

## 2024-06-10 ENCOUNTER — Encounter

## 2024-06-11 ENCOUNTER — Encounter: Admitting: Podiatry

## 2024-06-25 ENCOUNTER — Telehealth: Payer: Self-pay | Admitting: Podiatry

## 2024-06-25 NOTE — Telephone Encounter (Signed)
 Patient called in stating she thinks that something has come loose in her surgical foot. She stated that she had reached out in regards to swelling and spoke with provider but today something just doesn't feel right. She is willing to come to either [place] or [place] to be seen but wanted to see if she should be seen prior to her regularly scheduled appointment on 1/14. 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 Please advise if we need to get patient scheduled sooner or just keep her existing appointment. Thanks.

## 2024-06-26 ENCOUNTER — Ambulatory Visit

## 2024-07-02 ENCOUNTER — Ambulatory Visit (INDEPENDENT_AMBULATORY_CARE_PROVIDER_SITE_OTHER): Admitting: Podiatry

## 2024-07-02 ENCOUNTER — Ambulatory Visit (INDEPENDENT_AMBULATORY_CARE_PROVIDER_SITE_OTHER)

## 2024-07-02 VITALS — Ht 66.0 in | Wt 173.0 lb

## 2024-07-02 DIAGNOSIS — M21612 Bunion of left foot: Secondary | ICD-10-CM

## 2024-07-02 DIAGNOSIS — M2012 Hallux valgus (acquired), left foot: Secondary | ICD-10-CM | POA: Diagnosis not present

## 2024-07-02 NOTE — Progress Notes (Signed)
"  °  Subjective:  Patient ID: Ruth Hammond, female    DOB: 1970/06/25,  MRN: [id number]  Chief Complaint  Patient presents with   Post-op Follow-up    Rm 4 POV #3 DOS 05/23/2024 LT 1ST DOUBLE OSTEOTOMY([name] PATIENT)     54 y.o. female returns for post-op check.  Overall doing well  Review of Systems: Negative except as noted in the HPI. Denies N/V/F/Ch.   Objective:  There were no vitals filed for this visit. Body mass index is 27.92 kg/m. Constitutional Well developed. Well nourished.  Vascular Foot warm and well perfused. Capillary refill normal to all digits.  Calf is soft and supple, no posterior calf or knee pain, negative Homans' sign  Neurologic Normal speech. Oriented to person, place, and time. Epicritic sensation to light touch grossly present bilaterally.  Dermatologic Skin healing well without signs of infection. Skin edges well coapted without signs of infection.  Orthopedic: Edema and pain has resolved, good range of motion of MTP   Multiple view plain film radiographs: Adequate correction noted, screws intact and good consolidation across Akin osteotomy, early bone callus formation over the lateral screw formation Assessment:   1. Hallux valgus with bunions of left foot    Plan:  Patient was evaluated and treated and all questions answered.  S/p foot surgery left Doing well may begin gradual transition back to regular shoe gear over the next 2 weeks.  Follow-up with me in 6 weeks for new x-rays.  Once tolerating walking in shoe gear effectively may begin a low impact exercise at that point as well.  Return in about 6 weeks (around 08/13/2024) for post op (new x-rays).  "

## 2024-07-08 ENCOUNTER — Telehealth: Payer: Self-pay

## 2024-07-08 ENCOUNTER — Other Ambulatory Visit (HOSPITAL_COMMUNITY): Payer: Self-pay

## 2024-07-08 NOTE — Telephone Encounter (Signed)
 Pharmacy Patient Advocate Encounter   Received notification from [employer] that prior authorization for Eletriptan  Hydrobromide 40 Tabs is required/requested.   Insurance verification completed.   The patient is insured through [employer].   Per test claim: Refill too soon. PA is not needed at this time. Medication was filled 07/04/24. Next eligible fill date is 08/13/24.

## 2024-07-25 ENCOUNTER — Encounter: Payer: Self-pay | Admitting: Podiatry

## 2024-08-20 ENCOUNTER — Ambulatory Visit: Admitting: Podiatry
